# Patient Record
Sex: Male | Born: 1950 | Race: White | Hispanic: No | Marital: Married | Smoking: Former smoker
Health system: Southern US, Community
[De-identification: ages and names within clinical notes are randomized; demographics above are authoritative.]

## PROBLEM LIST (undated history)

## (undated) DIAGNOSIS — G8929 Other chronic pain: Secondary | ICD-10-CM

## (undated) DIAGNOSIS — K219 Gastro-esophageal reflux disease without esophagitis: Secondary | ICD-10-CM

## (undated) DIAGNOSIS — E785 Hyperlipidemia, unspecified: Secondary | ICD-10-CM

## (undated) DIAGNOSIS — I10 Essential (primary) hypertension: Secondary | ICD-10-CM

## (undated) DIAGNOSIS — I499 Cardiac arrhythmia, unspecified: Secondary | ICD-10-CM

## (undated) DIAGNOSIS — R002 Palpitations: Secondary | ICD-10-CM

## (undated) DIAGNOSIS — M25569 Pain in unspecified knee: Secondary | ICD-10-CM

## (undated) DIAGNOSIS — G4733 Obstructive sleep apnea (adult) (pediatric): Secondary | ICD-10-CM

## (undated) DIAGNOSIS — I2510 Atherosclerotic heart disease of native coronary artery without angina pectoris: Secondary | ICD-10-CM

## (undated) DIAGNOSIS — M549 Dorsalgia, unspecified: Secondary | ICD-10-CM

## (undated) DIAGNOSIS — M25561 Pain in right knee: Secondary | ICD-10-CM

## (undated) DIAGNOSIS — Z8679 Personal history of other diseases of the circulatory system: Secondary | ICD-10-CM

## (undated) DIAGNOSIS — R55 Syncope and collapse: Secondary | ICD-10-CM

## (undated) DIAGNOSIS — M545 Low back pain, unspecified: Secondary | ICD-10-CM

## (undated) HISTORY — DX: Low back pain: M54.5

## (undated) HISTORY — DX: Other chronic pain: G89.29

## (undated) HISTORY — DX: Hyperlipidemia, unspecified: E78.5

## (undated) HISTORY — DX: Atherosclerotic heart disease of native coronary artery without angina pectoris: I25.10

## (undated) HISTORY — DX: Palpitations: R00.2

## (undated) HISTORY — DX: Low back pain, unspecified: M54.50

## (undated) HISTORY — DX: Gastro-esophageal reflux disease without esophagitis: K21.9

## (undated) HISTORY — DX: Pain in right knee: M25.561

## (undated) HISTORY — DX: Dorsalgia, unspecified: M54.9

## (undated) HISTORY — DX: Pain in unspecified knee: M25.569

## (undated) HISTORY — DX: Personal history of other diseases of the circulatory system: Z86.79

## (undated) HISTORY — PX: COLONOSCOPY: SHX174

## (undated) HISTORY — PX: CHOLECYSTECTOMY: SHX55

## (undated) HISTORY — DX: Obstructive sleep apnea (adult) (pediatric): G47.33

---

## 1978-02-19 HISTORY — PX: VASECTOMY: SHX75

## 1980-02-20 HISTORY — PX: KNEE ARTHROSCOPY: SUR90

## 2000-05-27 ENCOUNTER — Emergency Department (HOSPITAL_COMMUNITY): Admission: EM | Admit: 2000-05-27 | Discharge: 2000-05-27 | Payer: Self-pay | Admitting: Emergency Medicine

## 2000-05-27 ENCOUNTER — Encounter: Payer: Self-pay | Admitting: Emergency Medicine

## 2001-04-21 ENCOUNTER — Encounter (INDEPENDENT_AMBULATORY_CARE_PROVIDER_SITE_OTHER): Payer: Self-pay | Admitting: Specialist

## 2001-04-21 ENCOUNTER — Ambulatory Visit (HOSPITAL_COMMUNITY): Admission: RE | Admit: 2001-04-21 | Discharge: 2001-04-21 | Payer: Self-pay | Admitting: Gastroenterology

## 2001-10-21 ENCOUNTER — Encounter: Admission: RE | Admit: 2001-10-21 | Discharge: 2001-10-21 | Payer: Self-pay | Admitting: Family Medicine

## 2001-10-21 ENCOUNTER — Encounter: Payer: Self-pay | Admitting: Family Medicine

## 2004-02-20 HISTORY — PX: LAPAROSCOPIC CHOLECYSTECTOMY: SUR755

## 2004-02-20 HISTORY — PX: ERCP: SHX60

## 2004-02-25 ENCOUNTER — Ambulatory Visit (HOSPITAL_COMMUNITY): Admission: RE | Admit: 2004-02-25 | Discharge: 2004-02-25 | Payer: Self-pay | Admitting: Family Medicine

## 2004-05-04 ENCOUNTER — Ambulatory Visit (HOSPITAL_COMMUNITY): Admission: RE | Admit: 2004-05-04 | Discharge: 2004-05-04 | Payer: Self-pay | Admitting: Family Medicine

## 2004-06-18 ENCOUNTER — Ambulatory Visit (HOSPITAL_COMMUNITY): Admission: RE | Admit: 2004-06-18 | Discharge: 2004-06-18 | Payer: Self-pay | Admitting: Gastroenterology

## 2004-06-25 ENCOUNTER — Emergency Department (HOSPITAL_COMMUNITY): Admission: EM | Admit: 2004-06-25 | Discharge: 2004-06-25 | Payer: Self-pay | Admitting: Emergency Medicine

## 2004-07-24 ENCOUNTER — Encounter (INDEPENDENT_AMBULATORY_CARE_PROVIDER_SITE_OTHER): Payer: Self-pay | Admitting: Specialist

## 2004-07-25 ENCOUNTER — Inpatient Hospital Stay (HOSPITAL_COMMUNITY): Admission: RE | Admit: 2004-07-25 | Discharge: 2004-07-26 | Payer: Self-pay | Admitting: Surgery

## 2005-07-10 IMAGING — RF DG ERCP WO/W SPHINCTEROTOMY
6 series · 6 of 6 positions shown · non-contrast
Comparison: none

CLINICAL DATA: Chronic cholecystitis.  
 ERCP WITH SPHINCTEROTOMY
 Seven images obtained during ERCP are submitted after the procedure for review.  Initial image demonstrates opacification of the pancreatic duct.  Second image shows a wire in the common duct which is now opacified.  Final four images were obtained after sphincterotomy and balloon passage for a reported ductal stone.  
 The last three images show residual contrast in the common duct without an apparent filling defect.

[Series 1: run · 1 of 1 slices shown (1 of 6)]
[im 1/1]
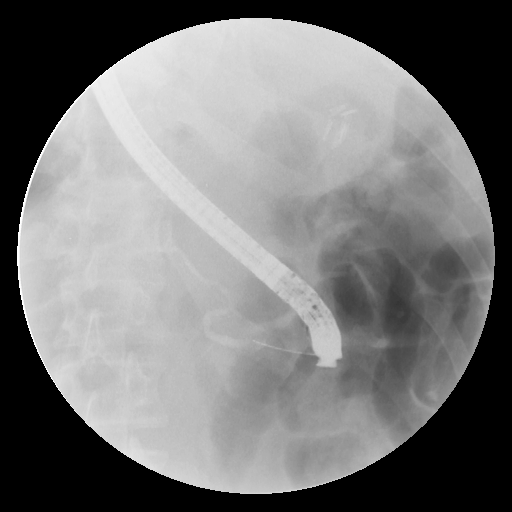

[Series 3: run · 1 of 1 slices shown (2 of 6)]
[im 1/1]
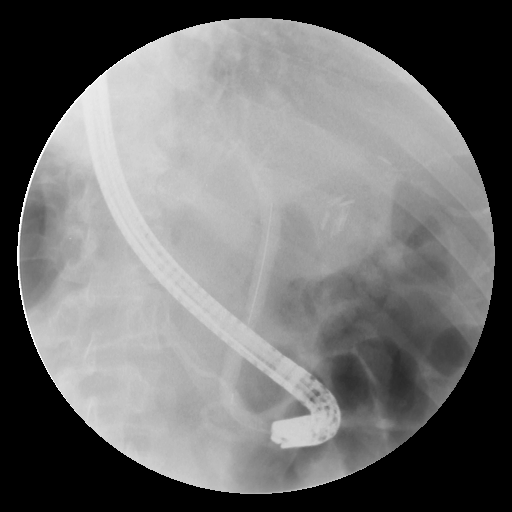

[Series 4: run · 1 of 1 slices shown (3 of 6)]
[im 1/1]
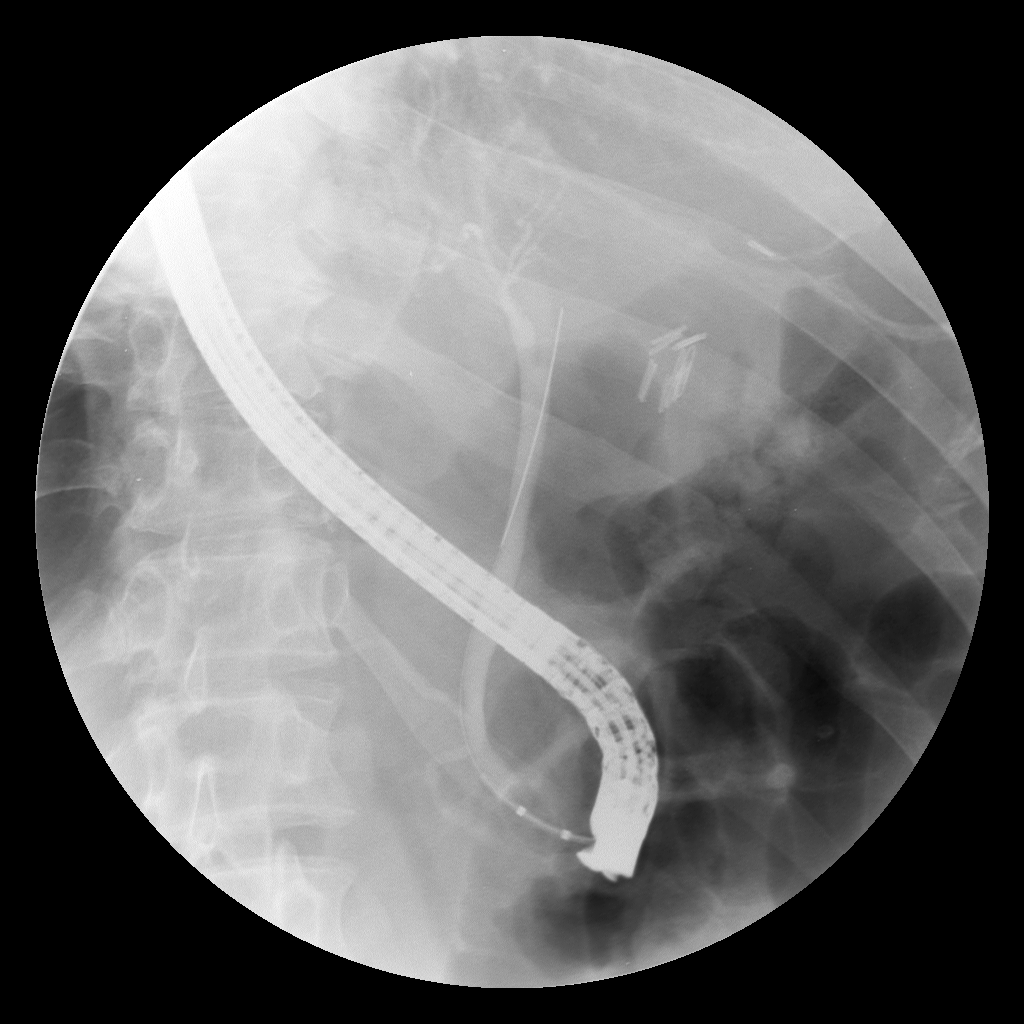

[Series 5: run · 1 of 1 slices shown (4 of 6)]
[im 1/1]
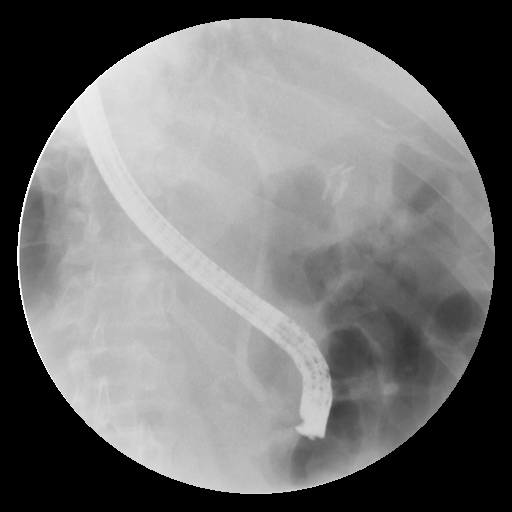

[Series 6: run · 1 of 1 slices shown (5 of 6)]
[im 1/1]
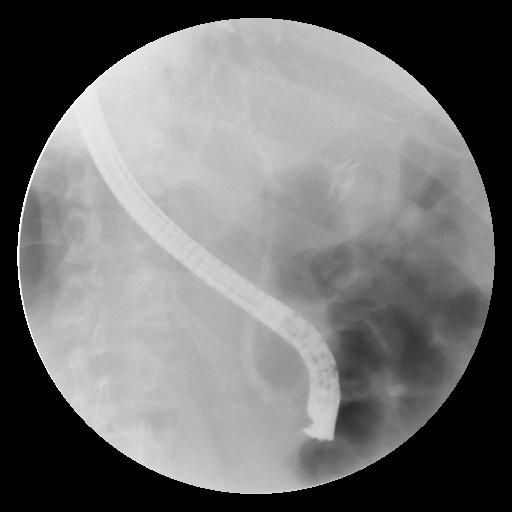

[Series 7: run · 1 of 1 slices shown (6 of 6)]
[im 1/1]
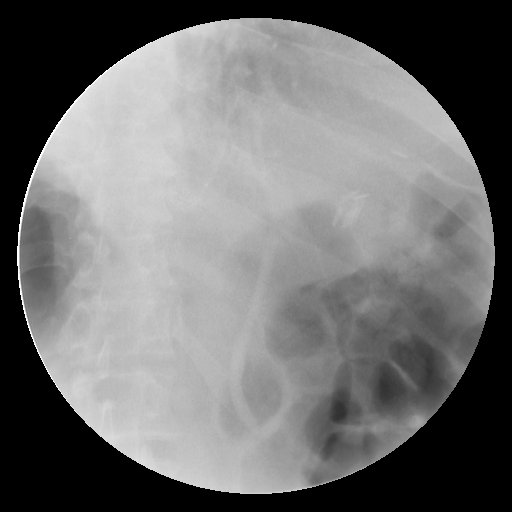

[6 of 6 positions shown; findings below may reference images not displayed]

IMPRESSION: ERCP with sphincterotomy for stone removal.  Final three images of the study after the wire has been removed from the duct demonstrate no residual filling defect although the duct is not well opacified. 
 [REDACTED]

## 2007-02-20 DIAGNOSIS — I2510 Atherosclerotic heart disease of native coronary artery without angina pectoris: Secondary | ICD-10-CM

## 2007-02-20 DIAGNOSIS — Z8679 Personal history of other diseases of the circulatory system: Secondary | ICD-10-CM

## 2007-02-20 HISTORY — DX: Atherosclerotic heart disease of native coronary artery without angina pectoris: I25.10

## 2007-02-20 HISTORY — DX: Personal history of other diseases of the circulatory system: Z86.79

## 2007-06-17 ENCOUNTER — Observation Stay (HOSPITAL_COMMUNITY): Admission: EM | Admit: 2007-06-17 | Discharge: 2007-06-18 | Payer: Self-pay | Admitting: Emergency Medicine

## 2007-06-18 ENCOUNTER — Encounter (INDEPENDENT_AMBULATORY_CARE_PROVIDER_SITE_OTHER): Payer: Self-pay | Admitting: Internal Medicine

## 2007-06-18 ENCOUNTER — Ambulatory Visit: Payer: Self-pay | Admitting: Surgery

## 2007-07-21 HISTORY — PX: CORONARY ANGIOPLASTY: SHX604

## 2007-08-08 ENCOUNTER — Inpatient Hospital Stay (HOSPITAL_BASED_OUTPATIENT_CLINIC_OR_DEPARTMENT_OTHER): Admission: RE | Admit: 2007-08-08 | Discharge: 2007-08-08 | Payer: Self-pay | Admitting: Cardiology

## 2010-07-04 NOTE — Discharge Summary (Signed)
[name], David [name]              ACCOUNT NO.:  [id number]   MEDICAL RECORD NO.:  [id number]          PATIENT TYPE:  INP   LOCATION:  4705                         FACILITY:  [facility]   PHYSICIAN:  Corinna L. Lendell Caprice, MDDATE OF BIRTH:  1950/10/14   DATE OF ADMISSION:  06/17/2007  DATE OF DISCHARGE:                               DISCHARGE SUMMARY   DISCHARGE DIAGNOSES:  1. Near-syncope, possibly vasovagal episode.  2. History of sleep apnea.  3. Hyperlipidemia.  4. Gastroesophageal reflux disease.   DISCHARGE MEDICATIONS:  Reviewed and as per H&P.  Follow up with Dr.  Elias Else within 1 week at which time, echocardiogram results should  be checked as it is pending at this time.   DIET:  Regular.   ACTIVITY:  Ad Lib.   CONDITION:  Stable.   CONSULTATIONS:  None.   PROCEDURES:  None.   PERTINENT LABORATORY DATA:  Point-of-care Enzymes negative.  Complete  metabolic panel significant for potassium of 5.4.  Urinalysis negative.  PT/PTT normal.  CBC unremarkable.   SPECIAL STUDIES/RADIOLOGY:  Echocardiogram pending.  EKG showed normal  sinus rhythm.  Chest x-ray showed nothing acute.  CT of the brain was  normal.  Preliminary report of the carotid Doppler is negative.   HISTORY AND HOSPITAL COURSE:  [name] is a 60 year old white male  patient of Dr. Nicholos Johns, who presented to the emergency room with a  presyncopal episode.  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  He reported that, he began  breathing quite heavily and subsequently started to see black.  He never  fully lost consciousness.  This has occurred in the past and apparently  he had an extensive workup 10 years ago including what sounds like a  Holter monitor.  The patient otherwise has been feeling fine.  Please  see H&P for details.  He had no change in orthostatic blood pressure.  He was not hypotensive.  He had a  normal exam.  He remained in normal sinus  rhythm on telemetry and workup  thus far has been negative.  I suspect this may have been vasovagal.  Nevertheless, I recommend that he follow up with Dr. Nicholos Johns within a week  and further workup if needed.  The patient's status has been changed  from admission to observation status.      Corinna L. Lendell Caprice, MD  Electronically Signed     CLS/MEDQ  D:  06/18/2007  T:  06/19/2007  Job:  [id number]   cc:   Molly Maduro A. Nicholos Johns, M.D.

## 2010-07-04 NOTE — Cardiovascular Report (Signed)
David, [name] NO.:  [id number]   MEDICAL RECORD NO.:  [id number]          PATIENT TYPE:  OIB   LOCATION:  1963                         FACILITY:  [facility]   PHYSICIAN:  Jake Bathe, MD      DATE OF BIRTH:  [birth year]   DATE OF PROCEDURE:  08/08/2007  DATE OF DISCHARGE:                            CARDIAC CATHETERIZATION   PROCEDURES:  1. Left heart catheterization.  2. Selective coronary angiography.  3. Left ventriculogram.   INDICATIONS:  A 60 year old male with syncopal episode while sitting  down, hyperlipidemia, GERD, obstructive sleep apnea, who recently had a  stress test secondary to the syncopal episode that showed an anterior  septal wall defect concerning for ischemia.  His ejection fraction was  also low calculated at 44% during nuclear stress test.  Prior  echocardiogram showed a normal ejection fraction of 55% with normal wall  thickness and normal left atrial size.  His sister in [place] recently  had cardiac evaluation for chest pain.   PAST MEDICAL HISTORY:  Significant for PSVT.   PROCEDURE DETAILS:  Informed consent was obtained.  Risk of stroke,  heart attack, death, renal impairment, bleeding, and arterial damage  were explained to the patient at length.  He was placed in the  catheterization table and prepped in a sterile fashion.  A 1% lidocaine  was used for local anesthesia.  Using the modified Seldinger technique,  a 4-French sheath was placed in the right femoral artery.  The left  coronary artery was selectively cannulated with a JL-4 catheter.  This  catheter was exchanged for a no-torque Williams right catheter, which  was used to selectively cannulate the right coronary artery.  Both  arteries were injected with Omnipaque hand injections and multiple views  were obtained.  Catheters were exchanged for angle pigtail, which was  used across the aortic valve and the left ventricle.  Hemodynamics  obtained.  Left  ventriculogram in the RAO position with power injection  of 36 mL of contrast was utilized.  A gradient obtained.  Following  procedure, manual compression held.  No hematoma.  The patient tolerated  the procedure well.   FINDINGS:  1. Left main artery - no angiographically significant coronary artery      disease.  2. Left anterior descending artery - there are 2 diagonal branches,      the first of which has approximately 20% ostial stenosis, very      minor.  Otherwise, the LAD continues to wrap around the apex.  No      angiographically significant coronary artery disease.  3. Left circumflex artery - there are 2 obtuse marginal branches.      There is no angiographically significant coronary artery disease.  4. Right coronary artery - this is a dominant vessel giving rise to      the PDA.  There is no angiographically significant coronary artery      disease.  5. Left ventriculogram - normal left ventricular ejection fraction      estimated at 55% with no wall motion abnormality.  No significant  mitral regurgitation appreciated. Ascending aortic root appears      normal.   HEMODYNAMICS:  Left ventricle 113/18 mmHg and aorta 113/60 with a mean  of 88 mmHg.   IMPRESSION:  1. No angiographically significant coronary artery disease, very mild      20% stenosis of the small first diagonal branch.  2. Normal left ventricular ejection fraction estimated at 55% with no      wall motion abnormalities.  3. Elevated left ventricular end-diastolic pressure of 80 mmHg      consistent with diastolic dysfunction.   RECOMMENDATIONS:  Continue aggressive risk factor modification, which  includes treatment of his hyperlipidemia, also treating PSVT as well as  diastolic dysfunction with cardizem CD 180mg . Has had recent bouts of  PSVT with dizziness. If calcium channel blocker unsuccessful, consider  EP consult. 2 week follow up. Reassuring catheterization.      Jake Bathe, MD   Electronically Signed     MCS/MEDQ  D:  08/08/2007  T:  08/08/2007  Job:  [id number]   cc:   Elana Alm. Nicholos Johns, M.D.

## 2010-07-04 NOTE — H&P (Signed)
NAMEFRISCO, David [name]              ACCOUNT NO.:  [id number]   MEDICAL RECORD NO.:  [id number]          PATIENT TYPE:  INP   LOCATION:  4705                         FACILITY:  [facility]   PHYSICIAN:  Kela Millin, M.D.DATE OF BIRTH:  1950-12-17   DATE OF ADMISSION:  06/17/2007  DATE OF DISCHARGE:                              HISTORY & PHYSICAL   PRIMARY CARE PHYSICIAN:  Robert A. Nicholos Johns, M.D.   CHIEF COMPLAINT:  Presyncope   HISTORY OF PRESENT ILLNESS:  The patient is a 60 year old white male  with a history of GERD, hyperlipidemia, bowel duct stone in the past,  and status post ERCP with sphincterotomy in 2006, who presents with the  above complaints.  He states that he was sitting down in his office with  a guest at his desk when he suddenly felt lightheaded, and then he  leaned back into the chair to keep himself from passing out, and  momentarily he states that everything blacked out, but he insists that  he did not lose consciousness at any point..  He states that following  that he continued to talk with his guest, and then again he had an  episode of lightheadedness.  Following this, he reports that he had a  quick, sharp chest pain that went across the left side of his chest for  just about a second, and he denies shortness of breath, diaphoresis, and  no radiation.  He admits to nausea but no vomiting.  At the time of my  interview in the ER, he states he still has some pain in his left upper  chest area close to his shoulder, but that this is different from the  pains he earlier, but it is worse with movement of his shoulder, and he  attributes it to some lifting that he did.  He denies palpitations,  cough, fevers, dysuria, diarrhea, melena, and no hematochezia.  The  patient also states that for a couple of days he had this sensation of a  band around his head, and he is quick to state that it was not like a  headache but just felt like he had a hat on, but he did not.   He denies  blurry vision, dysphagia, and no focal weakness.  He also reports that  for some time now he has had difficulty sleeping because he has sleep  apnea, and has a CPAP machine, but he has not been using it because he  has had problems with some infections on his face associated with the  mask that he has tried so far, but he is scheduled to follow up with his  physician to try to obtain a different face mask.  The patient remembers  that as a child growing up he had some episodes of fainting/passing out,  and these were usually if he was hurt or someone hit him, and that the  last such episodes that he had while in class was when he was in the  ninth grade.   He was seen in the ER, and point of care markers were negative.  A chest  x-ray was done.  It was negative for any cardiopulmonary process.  He is  admitted for further evaluation and management.   PAST MEDICAL HISTORY:  As above.   MEDICATIONS:  1. Aspirin 81 mg daily.  2. Nexium 40 mg daily.  3. Crestor 10 mg daily.   ALLERGIES:  NKDA.   SOCIAL HISTORY:  He quit tobacco about 6-7 years ago.  He drinks a  couple of beers about every other day or so during the week, but on the  weekends he drinks 4-5 beers per day.  He also states that some weeks he  goes without drinking, and denies any history of withdrawals.   FAMILY HISTORY:  His brother had a stroke at age 3, and he has some  septal defect.  His brother has a history of arrhythmias.  His mother  had a stroke and abdominal aneurysm, as well as carotid artery disease.   REVIEW OF SYSTEMS:  As per HPI.  Other review of systems negative.   PHYSICAL EXAMINATION:  GENERAL:  He is a middle-aged white male.  He is  alert, talkative, and appropriate, in no apparent distress.  VITAL SIGNS:  His blood pressure is 130/74, with a pulse of 82,  respiratory rate of 20, O2 saturation of 98%.  HEENT:  PERRL, EOMI.  Sclerae anicteric.  Moist mucous membranes, and no  oral  exudates.  NECK:  Supple, no adenopathy, no thyromegaly, no JVD, and no carotid  bruits appreciated.  LUNGS:  Clear to auscultation bilaterally.  No crackles, no wheezes.  CARDIOVASCULAR:  Regular rate and rhythm.  Normal S1 and S2.  ABDOMEN:  Soft, bowel sounds present, nontender, nondistended.  No  organomegaly, and no masses palpable.  EXTREMITIES:  No cyanosis, and no edema.  NEUROLOGIC:  Alert and oriented x3.  Cranial nerves II-XII grossly  intact.  Strength is 5/5 and symmetric, nonfocal.   LABORATORY DATA:  White cell count is 5.2, with a hemoglobin of 13.9,  hematocrit of 40.6, platelet count is 291.  Point of care markers  negative x3 in the ER.  Sodium is 138, with a potassium of 5.4, chloride  103, BUN 12, creatinine 1, glucose 99.  His LFTs are unremarkable.   ASSESSMENT AND PLAN:  1. Presyncope.  As discussed above, we will check orthostatics,      hydrate, EKG, serial cardiac enzymes, monitor on telemetry.  We      will also obtain a CT scan of his head, a 2D echocardiogram,      carotid Doppler ultrasound, follow, and further manage as      appropriate pending these studies.  2. Gastroesophageal reflux disease.  Continue PPI.  3. Hypercholesterolemia.  Continue Crestor.  4. Sleep apnea.  We will place on CPAP while here in the hospital,      follow.  5. Alcohol use.  Will place on multivitamins, thiamine, p.r.n. Ativan,      and monitor.      Kela Millin, M.D.  Electronically Signed     ACV/MEDQ  D:  06/18/2007  T:  06/18/2007  Job:  [id number]   cc:   Molly Maduro A. Nicholos Johns, M.D.

## 2010-07-07 NOTE — Op Note (Signed)
NAMETRAVAS, SCHEXNAYDER NO.:  [id number]   MEDICAL RECORD NO.:  [id number]          PATIENT TYPE:  AMB   LOCATION:  DAY                          FACILITY:  [facility]   PHYSICIAN:  Velora Heckler, MD      DATE OF BIRTH:  [birth year]   DATE OF PROCEDURE:  07/24/2004  DATE OF DISCHARGE:                                 OPERATIVE REPORT   PREOPERATIVE DIAGNOSES:  Chronic cholecystitis, abdominal pain, abnormal  liver function tests.   POSTOPERATIVE DIAGNOSES:  Chronic cholecystitis, abdominal pain, abnormal  liver function tests, probable CBD stone.   PROCEDURES:  1.  Laparoscopic cholecystectomy with intraoperative cholangiography.  2.  Liver biopsy (wedge).   SURGEON:  Velora Heckler, M.D.   ASSISTANT:  Lorne Skeens. Hoxworth, M.D.   ANESTHESIA:  General per Hezzie Bump. Okey Dupre, M.D.   ESTIMATED BLOOD LOSS:  Minimal.   PREPARATION:  Betadine.   COMPLICATIONS:  None.   INDICATIONS:  The patient is a 60 year old white male from [place], [place], referred by Dr. Carman Ching for history of abdominal pain. This  was intermittent. It occurs in the right upper quadrant and radiates to the  back. The patient has had abnormal liver function tests with a total  bilirubin rising as high as 6.4 on occasion. Extensive diagnostic testing  included abdominal ultrasound, CT scan, MRCP, and nuclear medicine  hepatobiliary scanning. All these studies demonstrated only a thick-walled  gallbladder. The patient now comes to surgery for cholecystectomy,  cholangiography, and liver biopsy.   BODY OF REPORT:  The procedure is done in OR #6 at the [facility]. The patient was brought to the operating room and placed in a  supine position on the operating room table. Following administration of  general anesthesia, the patient was prepped and draped in the usual strict  aseptic fashion. After ascertaining that an adequate level of anesthesia had  been  obtained, an infraumbilical incision was made with a #15 blade.  Dissection was carried down to the fascia. The fascia was incised in the  midline. The peritoneal cavity was entered cautiously. A 0 Vicryl  pursestring suture was placed in the fascia. An Hasson cannula was  introduced under direct vision and secured with a pursestring suture. The  abdomen was insufflated with carbon dioxide. The laparoscope was introduced  and the abdomen explored. The internal viscera appeared grossly normal. The  liver appears normal. Operative ports were placed along the right costal  margin, the midline, midclavicular line, and the anterior axillary line. The  fundus of the gallbladder was grasped and retracted cephalad. Dissection was  begun at the neck of the gallbladder. The peritoneum was incised. The cystic  duct was mildly dilated. It appears quite long. It was carefully dissected  out, and a clip was placed at the neck of the gallbladder. The cystic duct  was incised. Clear yellow bile emanates from the cystic duct. A Cook  cholangiography catheter was introduced through a stab wound in the right  upper quadrant and inserted into the cystic duct. It was secured with a  Ligaclip.  Using C-arm fluoroscopy, real-time cholangiography was performed.  There was rapid filling of the long tortuous cystic duct. There was filling  of a generous caliber common bile duct that does not appear grossly dilated.  There was reflux of contrast into the common hepatic duct. There was flow  distally to the ampulla but minimal flow into the duodenum. There appears to  be a convexity in the dye column consistent with possible stone at the level  of the ampulla. The [facility] catheter was withdrawn, and clip was retrieved. The  cystic duct was clipped four times with Ligaclips and divided. The cystic  artery was dissected out, doubly clipped, and divided. The gallbladder was  then excised from the gallbladder bed using the  [facility] electrocautery for  hemostasis. The gallbladder was completely excised and placed into an  EndoCatch bag.   Next, a wedge biopsy of the liver was performed. This was performed at the  right hepatic lobe anterior edge just in front of the gallbladder fossa. The  edge was grasped with a stone forceps and crushing with the instrument was  performed. Using the [facility] electrocautery, the margin around the biopsy site  was cauterized with the electrocautery. A 1-cm x 1-cm x 1-cm fragment of  liver was then delivered out of the epigastric port and submitted to  pathology for review. Good hemostasis was noted along the liver edge. The  right upper quadrant was copiously irrigated with warm saline which was  evacuated. Good hemostasis was noted in the gallbladder bed. Gallbladder was  retrieved in the EndoCatch bag and brought out through the umbilical port  without difficulty. The 0 Vicryl pursestring suture was tied securely.  Palpation of the gallbladder reveals no evidence of cholelithiasis. The  pneumoperitoneum was released. The port sites were inspected, and good  hemostasis was noted at all port sites. All ports were removed. All port  sites were anesthetized with local anesthetic. All wounds were closed with  interrupted 4-0 Vicryl subcuticular  sutures. The wounds were washed and dried. Benzoin and Steri-Strips were  applied. Sterile dressings were applied. The patient was awakened from  anesthesia and brought to the recovery room in stable condition. The patient  tolerated the procedure well.       TMG/MEDQ  D:  07/24/2004  T:  07/24/2004  Job:  [id number]   cc:   Fayrene Fearing L. Malon Kindle., M.D.  [address]  [place]  [place] [address]  Fax: [phone]   Elana Alm. Nicholos Johns, M.D.  [address]  [place]  [place] [address]  Fax: [phone]   Velora Heckler, MD  [phone] [address] [place]  [place] [address]

## 2010-07-07 NOTE — Procedures (Signed)
[place]  Patient:    David [name], David [name] Visit Number: [id number] MRN: [id number]          Service Type: END Location: ENDO Attending Physician:  Rich Brave Dictated by:   Florencia Reasons, M.D. Proc. Date: 04/21/01 Admit Date:  04/21/2001   CC:         Molly Maduro A. Eliezer Lofts., M.D.   Procedure Report  PROCEDURE PERFORMED:  Colonoscopy with biopsy.  ENDOSCOPIST:  Florencia Reasons, M.D.  INDICATIONS FOR PROCEDURE:  The patient is a 60 year old male with a family history of colon cancer in his father.  FINDINGS:  Diminutive polyp at 30 cm.  DESCRIPTION OF PROCEDURE:  The nature, purpose and risks of the procedure had been discussed with the patient, who provided written consent.  Sedation was fentanyl 100 mcg and Versed 14 mg IV without arrhythmias or desaturation.  The Olympus adult video colonoscope was advanced easily to the cecum as identified by clear visualization of the appendiceal orifice and pull-back was then performed.  The quality of the prep was excellent and it is felt that all areas were well seen.  This was a normal examination except for a 2 to 3 mm sessile polyp at about 30 cm in the external anal opening removed by a couple of cold biopsies.  No large polyps, cancer, colitis, vascular malformations or diverticulosis were noted and retroflexion in the rectum was normal.  The patient tolerated the procedure well and there were no apparent complications.  IMPRESSION:  Diminutive sigmoid polyp.  PLAN: Consider follow-up colonoscopy in five years in view of the family history of colon cancer.Dictated by:   Florencia Reasons, M.D. Attending Physician:  Rich Brave DD:  04/21/01 TD:  04/21/01 Job: [id number]

## 2010-07-07 NOTE — Op Note (Signed)
NAMEJAMISEN, David [name] NO.:  [id number]   MEDICAL RECORD NO.:  [id number]          PATIENT TYPE:  OBV   LOCATION:  1615                         FACILITY:  [facility]   PHYSICIAN:  Bernette Redbird, M.D.   DATE OF BIRTH:  06-May-1950   DATE OF PROCEDURE:  07/25/2004  DATE OF DISCHARGE:                                 OPERATIVE REPORT   PROCEDURE:  ERCP, sphincterotomy, stone extraction.   INDICATIONS:  A 60 year old with recurring abdominal pain and intermittent  elevation of liver chemistries with negative MRCP, who underwent an empiric  laparoscopic cholecystectomy yesterday at which time intraoperative  cholangiography showed an apparent distal common duct stone.   FINDINGS:  Small stone removed.   DESCRIPTION OF PROCEDURE:  The nature, purpose and risks of the procedure  had been discussed with the patient who provided written consent. Sedation  was Phenergan 12.5 mg IV, fentanyl 62.5 mcg IV and Versed 7 mg IV prior to  and during the course of the procedure. He also received Cipro IV prior to  the procedure and perhaps during the early part of the procedure as  antibiotic prophylaxis, and glucagon 1 mg IV during the course the procedure  to reduce duodenal contractility.   The patient did have probable transient oxygen desaturation down to about  79% before the procedure was started, which responded to turning up his  oxygen flow rate and repositioning the pulse ox probe. It is not clear  whether he was truly desaturated or whether this was just a problem with the  probe picking up correctly but the patient was arousable and his color was  good. In any event, he remained clinically stable during the course of the  procedure. There were no other episodes of apparent desaturation.   The procedure was done in radiology. The Olympus video duodenoscope was  passed blindly into the esophagus and advanced into a normal-appearing  stomach. A moderate gastric residual of  dark clear fluid was suctioned up.  It took some doing to traverse the pylorus and the stomach seen to be  somewhat patulous, but with the patient temporarily lifted up into the left  lateral decubitus position, this was ultimately accomplished after several  minutes of trying.   The major ampulla was rapidly identified, adjacent to a moderately large  duodenal diverticulum. There was a squirt of a rather copious amount of  clear amber bile from the papilla.   It took several minutes to achieve selective cannulation of her common duct.  On one occasion, the guidewire did enter the pancreatic duct as confirmed by  injection of a small amount of contrast which opacified the majority of the  duct but not the side branches and certainly there was no evidence of  acinarization. The pancreatic duct did not drain immediately but did drained  during the course of the procedure over the next 10-15 minutes or so and.   Using the triple-lumen sphincterotome loaded with the guidewire, and using  continued efforts, I was successful in achieving selective cannulation of  the common bile duct. Injection of contrast did not show  any obvious  intraluminal filling defects. The common duct was at the upper limit of  normal in size and without any evident stricture.   Nonetheless, despite the absence of obvious stones in the duct, based on the  patient's history, I  felt that an empiric sphincterotomy was warranted.  This was accomplished using the sphincterotome and the ERBE cautery device.  I cut approximately a 12 mm sphincterotomy without bleeding. There was a  gush of clear amber bile.   A single initial sweep was made with an 8.5 mm balloon which came through  the sphincterotomy without significant difficulty, and ahead of it was a  solitary roughly 5-6 mm stone. Another sweep did not produce any additional  material and an occlusion cholangiogram thereafter showed no filling defects  although  drainage was somewhat sluggish.   The patient tolerated this procedure well and there were no apparent  complications.   IMPRESSION:  1.  5 mm common bile duct stone delivered via a 12 mm sphincterotomy as      described above.  2.  Duodenal diverticulum.   PLAN:  Monitoring of liver chemistries and clinical follow-up through the  office of his attending surgeon.      RB/MEDQ  D:  07/25/2004  T:  07/25/2004  Job:  [id number]   cc:   Velora Heckler, MD  [address]  [place]  [place] [address]   Dario Guardian, M.D.  [address]  [place]  [place] [address]  Fax: [phone]

## 2010-07-07 NOTE — Consult Note (Signed)
NAMERAWLEIGH, RODE NO.:  [id number]   MEDICAL RECORD NO.:  [id number]          PATIENT TYPE:  OBV   LOCATION:  1615                         FACILITY:  [facility]   PHYSICIAN:  Althea Grimmer. Santogade, M.D.DATE OF BIRTH:  Nov 05, 1950   DATE OF CONSULTATION:  07/24/2004  DATE OF DISCHARGE:                                   CONSULTATION   HISTORY OF PRESENT ILLNESS:  Mr. Cieslinski is a 60 year old male with a history  of apparent biliary colic with abnormal transaminases rising to as high as  an ALT of 565 with a total bilirubin of 6.4 associated with nausea and  abdominal pain.  These episodes have resolved intermittently.  Imaging  studies apparently revealed sludge in the neck of the gallbladder.  This  morning, he underwent a laparoscopic cholecystectomy.  Due to his history of  abnormal liver function tests, a wedge liver biopsy was performed.  There  was no mention that the liver looked abnormal on the operative note.  An  intraoperative cholangiogram was also done, and the preliminary reading is  that there is probable gallstones at the ampulla.  Currently, the patient is  postoperative.  He denies any worsening abdominal pain.  His ALT is 228, his  alkaline phosphatase is 137, his total bilirubin is 1.1.  He is alert and  only complaining of postoperative discomfort at the incision sites.   CURRENT MEDICATIONS:  Only Nexium for reflux.   ALLERGIES:  None reported.   FAMILY HISTORY:  Mother and sister had cholecystectomies.   SOCIAL HISTORY:  Moderate alcohol use.   PHYSICAL EXAMINATION:  VITAL SIGNS:  He is afebrile.  Blood pressure 137/81,  pulse 98 and regular.  SKIN:  Normal.  HEENT:  Eyes anicteric.  Conjunctivae pink.  Oropharynx unremarkable.  NECK:  Supple without thyromegaly.  CHEST:  Chest sounds clear.  HEART:  Heart sounds regular rate and rhythm.  ABDOMEN:  Mildly distended and postoperative with no significant drainage or  blood on the bandages.  EXTREMITIES:  Without cyanosis, clubbing, edema, or rash.   IMPRESSION:  Status post laparoscopic cholecystectomy with a probable distal  common bile duct stone.   PLAN:  Metabolic profile and CBC will be checked tomorrow, and the patient  tentatively is scheduled for an ERCP in the afternoon after review of his  morning status and labs.       PJS/MEDQ  D:  07/24/2004  T:  07/24/2004  Job:  [id number]   cc:   Bernette Redbird, M.D.  [address]  [place], [place] [address]  Fax: [phone]   Dario Guardian, M.D.  Fax: [phone]   Velora Heckler, MD  [phone]  [place]  [place] [address]   Llana Aliment. Malon Kindle., M.D.  [address]  [place]  [place] [address]  Fax: [phone]

## 2010-11-14 LAB — CBC
HCT: 40.6
Hemoglobin: 13.9
MCHC: 34.2
MCV: 87.6
Platelets: 291
RBC: 4.63
RDW: 12.5
WBC: 5.2

## 2010-11-14 LAB — URINALYSIS, ROUTINE W REFLEX MICROSCOPIC
Bilirubin Urine: NEGATIVE
Glucose, UA: NEGATIVE
Hgb urine dipstick: NEGATIVE
Ketones, ur: NEGATIVE
Nitrite: NEGATIVE
Protein, ur: NEGATIVE
Specific Gravity, Urine: 1.014
Urobilinogen, UA: 1
pH: 6.5

## 2010-11-14 LAB — POCT I-STAT, CHEM 8
BUN: 12
Calcium, Ion: 1.19
Chloride: 103
Creatinine, Ser: 1
Glucose, Bld: 99
HCT: 42
Hemoglobin: 14.3
Potassium: 5.4 — ABNORMAL HIGH
Sodium: 138
TCO2: 30

## 2010-11-14 LAB — POCT CARDIAC MARKERS
CKMB, poc: <1 — ABNORMAL LOW
CKMB, poc: <1 — ABNORMAL LOW
CKMB, poc: <1 — ABNORMAL LOW
Myoglobin, poc: 36.4
Myoglobin, poc: 46.7
Myoglobin, poc: 58.2
Operator id: 234501
Operator id: 234501
Operator id: 234501
Troponin i, poc: <0.05
Troponin i, poc: <0.05
Troponin i, poc: <0.05

## 2010-11-14 LAB — HEPATIC FUNCTION PANEL
ALT: 14
AST: 32
Albumin: 3.6
Alkaline Phosphatase: 32 — ABNORMAL LOW
Bilirubin, Direct: 0.5 — ABNORMAL HIGH
Indirect Bilirubin: 0.7
Total Bilirubin: 1.2
Total Protein: 6.6

## 2010-11-14 LAB — CK TOTAL AND CKMB (NOT AT ARMC)
CK, MB: 1.2
Relative Index: 1.1
Total CK: 111

## 2010-11-14 LAB — APTT: aPTT: 26

## 2010-11-14 LAB — TROPONIN I: Troponin I: 0.01

## 2010-11-14 LAB — URINE CULTURE
Colony Count: NO GROWTH
Culture: NO GROWTH
Special Requests: NEGATIVE

## 2010-11-14 LAB — CARDIAC PANEL(CRET KIN+CKTOT+MB+TROPI)
CK, MB: 1.1
Relative Index: 1
Total CK: 108
Troponin I: <0.01

## 2010-11-14 LAB — PROTIME-INR
INR: 0.9
Prothrombin Time: 12.7

## 2010-11-14 LAB — TSH: TSH: 2.389

## 2013-08-10 ENCOUNTER — Encounter: Payer: Self-pay | Admitting: *Deleted

## 2014-06-30 ENCOUNTER — Other Ambulatory Visit: Payer: Self-pay | Admitting: Family Medicine

## 2014-06-30 ENCOUNTER — Ambulatory Visit
Admission: RE | Admit: 2014-06-30 | Discharge: 2014-06-30 | Disposition: A | Discharge status: Home / Self Care | Payer: Commercial Managed Care - PPO | Source: Ambulatory Visit | Attending: Family Medicine | Admitting: Family Medicine

## 2014-06-30 DIAGNOSIS — R0600 Dyspnea, unspecified: Secondary | ICD-10-CM

## 2014-06-30 DIAGNOSIS — R0609 Other forms of dyspnea: Principal | ICD-10-CM

## 2014-07-22 ENCOUNTER — Other Ambulatory Visit: Payer: Self-pay

## 2014-07-22 ENCOUNTER — Ambulatory Visit (INDEPENDENT_AMBULATORY_CARE_PROVIDER_SITE_OTHER): Payer: Commercial Managed Care - PPO | Admitting: Internal Medicine

## 2014-07-22 VITALS — BP 147/80 | HR 64 | Ht 71.5 in | Wt 191.8 lb

## 2014-07-22 DIAGNOSIS — R079 Chest pain, unspecified: Secondary | ICD-10-CM

## 2014-07-22 DIAGNOSIS — R0789 Other chest pain: Secondary | ICD-10-CM

## 2014-07-22 DIAGNOSIS — R002 Palpitations: Secondary | ICD-10-CM | POA: Diagnosis not present

## 2014-07-22 DIAGNOSIS — R0989 Other specified symptoms and signs involving the circulatory and respiratory systems: Secondary | ICD-10-CM | POA: Diagnosis not present

## 2014-07-22 DIAGNOSIS — I471 Supraventricular tachycardia, unspecified: Secondary | ICD-10-CM | POA: Insufficient documentation

## 2014-07-22 DIAGNOSIS — R0602 Shortness of breath: Secondary | ICD-10-CM

## 2014-07-22 DIAGNOSIS — R42 Dizziness and giddiness: Secondary | ICD-10-CM

## 2014-07-22 NOTE — Progress Notes (Signed)
Electrophysiology Office Note   Date:  07/22/2014   ID:  David [name], DOB 1950/07/05, MRN [id number]  PCP:  Vena Austria, MD  Cardiologist:  Previously Dr Marlou Porch Primary Electrophysiologist: Thompson Grayer, MD  Referring:  [facility]que [facility]   Chief Complaint  Patient presents with  . Dizziness     History of Present Illness: David [name] is a 64 y.o. male who presents today for electrophysiology evaluation.  He presents for further management of dizziness, SOB and chest discomfort.  He reports having palpitations and dizziness for "years".  He was evaluated by Dr Marlou Porch in 2009 and found to have nonsustained atach.  He was placed on diltiazem and did very well until the last few months.  Recently, he has developed recurrent symptoms of dizziness.  He has not had palpitations with this.  He was seen by Dr Alyson Ingles and his diltiazem was increased without improvement.  He describes his symptoms as a "head spinning" but does not feel that these are vertiginous in etiology.  He denies recent URI symptoms, tinnitus, or a positional component.  He has not passed out.  These episodes have occurred several times per month over the past 3 months.  He is unaware of triggers/ precipitants. He reports that 2 nights ago that he had SOB with chest tightness and pain radiating into his neck.  This occurred at night and lasted about an hour and resolved with sleeping.   Today, he denies symptoms of lower extremity edema, claudication,  syncope, bleeding, or neurologic sequela. The patient is tolerating medications without difficulties and is otherwise without complaint today.    Past Medical History  Diagnosis Date  . OSA (obstructive sleep apnea)     BIPAP  . Knee pain, right     PARTICULARY AT NIGHT  . Back pain, chronic   . GERD (gastroesophageal reflux disease)   . Hyperlipidemia     INTOLERANT OF STATINS  . Palpitations   . History of PSVT (paroxysmal supraventricular tachycardia)  2009    nonsustained atach per Dr Marlou Porch on monitor in 2009. treated with diltiazem  . Chronic low back pain   . Chronic knee pain   . CAD (coronary artery disease) 2009    nonobstructive by cath   Past Surgical History  Procedure Laterality Date  . Knee arthroscopy Right 1982  . Vasectomy  1980  . Ercp  2006  . Laparoscopic cholecystectomy  2006  . Colonoscopy      2003, 2008, 2013  . Coronary angioplasty  07/2007    NONOBSTRUCTIVE CORONARY PLAQUING- MINOR 20 % OSTIAL DIAGONAL STENOSIS, NORMAL LEFT VENTRICULAR EF 55%  . Cholecystectomy       Current Outpatient Prescriptions  Medication Sig Dispense Refill  . aspirin EC 81 MG tablet Take 81 mg by mouth daily.    Marland Kitchen diltiazem (DILACOR XR) 180 MG 24 hr capsule Take 180 mg by mouth 2 (two) times daily.     Marland Kitchen esomeprazole (NEXIUM) 40 MG capsule Take 40 mg by mouth daily at 12 noon.    . Multiple Vitamins-Minerals (MULTIVITAMIN WITH MINERALS) tablet Take 1 tablet by mouth daily.    . rosuvastatin (CRESTOR) 10 MG tablet Take 10 mg by mouth daily.    . cyclobenzaprine (FLEXERIL) 5 MG tablet PT. HAS NOT STARTED THIS MEDICATION YET (07/22/14)  0   No current facility-administered medications for this visit.    Allergies:   Review of patient's allergies indicates no known allergies.   Social History:  The  patient  reports that he quit smoking about 14 years ago. His smoking use included Cigarettes. He smoked 2.50 packs per day. He does not have any smokeless tobacco history on file. He reports that he drinks alcohol.   Family History:  The patient's  family history includes CVA (age of onset: 3) in his mother; Colon cancer in his father; Diabetes in his maternal grandmother; Dysrhythmia in his brother; GER disease in his sister; Heart attack in his father; Heart attack (age of onset: 60) in his mother; Hyperlipidemia in his sister; Hypertension in his brother and sister; Pneumonia in his father; Stroke (age of onset: 61) in his brother.     ROS:  Please see the history of present illness.   All other systems are reviewed and negative.    PHYSICAL EXAM: VS:  BP 147/80 mmHg  Pulse 64  Ht 5' 11.5" (1.816 m)  Wt 87 kg (191 lb 12.8 oz)  BMI 26.38 kg/m2 , BMI Body mass index is 26.38 kg/(m^2). GEN: Well nourished, well developed, in no acute distress HEENT: normal Neck: no JVD, + R carotid bruit Cardiac: RRR; no murmurs, rubs, or gallops,no edema  Respiratory:  clear to auscultation bilaterally, normal work of breathing GI: soft, nontender, nondistended, + BS MS: no deformity or atrophy Skin: warm and dry  Neuro:  Strength and sensation are intact Psych: euthymic mood, full affect  EKG:  EKG is ordered today. The ekg ordered today shows sinus rhythm, PVCs   Recent Labs: No results found for requested labs within last 365 days.    Lipid Panel  No results found for: CHOL, TRIG, HDL, CHOLHDL, VLDL, LDLCALC, LDLDIRECT   Wt Readings from Last 3 Encounters:  07/22/14 87 kg (191 lb 12.8 oz)      Other studies Reviewed: Additional studies/ records that were reviewed today include: more than 10 pages of records are reviewed including Dr Reade's notes and Clarisa Fling notes, prior myoview, echo, and cath reports  Review of the above records today demonstrates: recent echo reveals preserved EF with no valvular abnormality, prior myoview was abnormal (anterior ischemic) with subsequent cath revealing minimal CAD   ASSESSMENT AND PLAN:  1.  Dizziness/ presyncope Unclear etiology.  Orthostatics performed today were normal Cannot exclude arrhythmogenic cause Will place 30 day event monitor Adequate hydration is encouraged Given R carotid bruit, will also obtain carotid dopplers  2. Chest discomfort/ SOB Given worsening symptoms, will obtain GXT myoview.  Given previously abnormal myoview, would need to compare imaging.  Would not pursue additional workup unless significant changes are apparent  3. Prior atach/  SVT Repeat 30 day monitor as above Continue diltiazem for now  Current medicines are reviewed at length with the patient today.   The patient does not have concerns regarding his medicines.  The following changes were made today:  none  Labs/ tests ordered today include:  Orders Placed This Encounter  Procedures  . Cardiac event monitor  . Myocardial Perfusion Imaging  . EKG 12-Lead    Follow-up: return for further evaluation in 6 weeks (after results of the 30 day monitor are available)   Signed, Thompson Grayer, MD  07/22/2014 9:50 PM     [place] [address] [place] [address] [phone] (office) [phone] (fax)

## 2014-07-22 NOTE — Patient Instructions (Signed)
Medication Instructions:  Your physician recommends that you continue on your current medications as directed. Please refer to the Current Medication list given to you today.   Labwork: None ordered  Testing/Procedures: Your physician has requested that you have a carotid duplex. This test is an ultrasound of the carotid arteries in your neck. It looks at blood flow through these arteries that supply the brain with blood. Allow one hour for this exam. There are no restrictions or special instructions.  Your physician has requested that you have en exercise stress myoview. For further information please visit [email or web address]. Please follow instruction sheet, as given.  Your physician has recommended that you wear an event monitor. Event monitors are medical devices that record the heart's electrical activity. Doctors most often [place] these monitors to diagnose arrhythmias. Arrhythmias are problems with the speed or rhythm of the heartbeat. The monitor is a small, portable device. You can wear one while you do your normal daily activities. This is usually used to diagnose what is causing palpitations/syncope (passing out).    Follow-Up:  Your physician recommends that you schedule a follow-up appointment in: 6 weeks with Dr Rayann Heman   Any Other Special Instructions Will Be Listed Below (If Applicable).

## 2014-07-23 ENCOUNTER — Other Ambulatory Visit: Payer: Self-pay

## 2014-07-23 ENCOUNTER — Inpatient Hospital Stay (HOSPITAL_COMMUNITY)
Admission: EM | Admit: 2014-07-23 | Discharge: 2014-07-24 | DRG: 312 | Disposition: A | Discharge status: Home / Self Care | Payer: Commercial Managed Care - PPO | Attending: Interventional Cardiology | Admitting: Interventional Cardiology

## 2014-07-23 ENCOUNTER — Other Ambulatory Visit (HOSPITAL_COMMUNITY): Payer: Self-pay

## 2014-07-23 ENCOUNTER — Encounter (HOSPITAL_COMMUNITY): Payer: Self-pay | Admitting: Cardiology

## 2014-07-23 DIAGNOSIS — Z9049 Acquired absence of other specified parts of digestive tract: Secondary | ICD-10-CM | POA: Diagnosis present

## 2014-07-23 DIAGNOSIS — R42 Dizziness and giddiness: Secondary | ICD-10-CM | POA: Diagnosis present

## 2014-07-23 DIAGNOSIS — I471 Supraventricular tachycardia, unspecified: Secondary | ICD-10-CM | POA: Diagnosis present

## 2014-07-23 DIAGNOSIS — R55 Syncope and collapse: Secondary | ICD-10-CM | POA: Diagnosis present

## 2014-07-23 DIAGNOSIS — Z7982 Long term (current) use of aspirin: Secondary | ICD-10-CM

## 2014-07-23 DIAGNOSIS — K219 Gastro-esophageal reflux disease without esophagitis: Secondary | ICD-10-CM | POA: Diagnosis present

## 2014-07-23 DIAGNOSIS — I10 Essential (primary) hypertension: Secondary | ICD-10-CM | POA: Diagnosis present

## 2014-07-23 DIAGNOSIS — Z9861 Coronary angioplasty status: Secondary | ICD-10-CM

## 2014-07-23 DIAGNOSIS — E785 Hyperlipidemia, unspecified: Secondary | ICD-10-CM | POA: Diagnosis present

## 2014-07-23 DIAGNOSIS — G4733 Obstructive sleep apnea (adult) (pediatric): Secondary | ICD-10-CM | POA: Diagnosis present

## 2014-07-23 DIAGNOSIS — G8929 Other chronic pain: Secondary | ICD-10-CM | POA: Diagnosis present

## 2014-07-23 DIAGNOSIS — R0989 Other specified symptoms and signs involving the circulatory and respiratory systems: Secondary | ICD-10-CM | POA: Diagnosis not present

## 2014-07-23 DIAGNOSIS — R0789 Other chest pain: Secondary | ICD-10-CM | POA: Diagnosis present

## 2014-07-23 DIAGNOSIS — R079 Chest pain, unspecified: Secondary | ICD-10-CM | POA: Diagnosis present

## 2014-07-23 DIAGNOSIS — I2510 Atherosclerotic heart disease of native coronary artery without angina pectoris: Secondary | ICD-10-CM | POA: Diagnosis present

## 2014-07-23 DIAGNOSIS — Z87891 Personal history of nicotine dependence: Secondary | ICD-10-CM

## 2014-07-23 HISTORY — DX: Syncope and collapse: R55

## 2014-07-23 LAB — CBC
HCT: 41.1 % (ref 39.0–52.0)
Hemoglobin: 14.1 g/dL (ref 13.0–17.0)
MCH: 30.4 pg (ref 26.0–34.0)
MCHC: 34.3 g/dL (ref 30.0–36.0)
MCV: 88.6 fL (ref 78.0–100.0)
Platelets: 257 10*3/uL (ref 150–400)
RBC: 4.64 MIL/uL (ref 4.22–5.81)
RDW: 12.4 % (ref 11.5–15.5)
WBC: 6.8 10*3/uL (ref 4.0–10.5)

## 2014-07-23 LAB — I-STAT CHEM 8, ED
BUN: 10 mg/dL (ref 6–20)
Calcium, Ion: 1.26 mmol/L (ref 1.13–1.30)
Chloride: 102 mmol/L (ref 101–111)
Creatinine, Ser: 1 mg/dL (ref 0.61–1.24)
Glucose, Bld: 97 mg/dL (ref 65–99)
HCT: 44 % (ref 39.0–52.0)
Hemoglobin: 15 g/dL (ref 13.0–17.0)
Potassium: 4.3 mmol/L (ref 3.5–5.1)
Sodium: 138 mmol/L (ref 135–145)
TCO2: 24 mmol/L (ref 0–100)

## 2014-07-23 LAB — I-STAT TROPONIN, ED: Troponin i, poc: 0.01 ng/mL (ref 0.00–0.08)

## 2014-07-23 LAB — MAGNESIUM: Magnesium: 2.1 mg/dL (ref 1.7–2.4)

## 2014-07-23 LAB — TROPONIN I
Troponin I: <0.03 ng/mL (ref <0.031)
Troponin I: <0.03 ng/mL (ref <0.031)

## 2014-07-23 LAB — HEPARIN LEVEL (UNFRACTIONATED): Heparin Unfractionated: 0.51 IU/mL (ref 0.30–0.70)

## 2014-07-23 MED ORDER — SODIUM CHLORIDE 0.9 % IV SOLN
INTRAVENOUS | Status: DC
  Administered 2014-07-23: 21:00:00 via INTRAVENOUS

## 2014-07-23 MED ORDER — ONDANSETRON HCL 4 MG/2ML IJ SOLN: 4.0000 mg | Freq: Four times a day (QID) | INTRAMUSCULAR | Status: DC | PRN

## 2014-07-23 MED ORDER — ASPIRIN EC 81 MG PO TBEC: 81.0000 mg | DELAYED_RELEASE_TABLET | Freq: Every day | ORAL | Status: DC

## 2014-07-23 MED ORDER — PANTOPRAZOLE SODIUM 40 MG PO TBEC
80.0000 mg | DELAYED_RELEASE_TABLET | Freq: Every day | ORAL | Status: DC
  Administered 2014-07-24: 80 mg via ORAL
  Filled 2014-07-23 (×2): qty 2

## 2014-07-23 MED ORDER — ALPRAZOLAM 0.25 MG PO TABS: 0.2500 mg | ORAL_TABLET | Freq: Two times a day (BID) | ORAL | Status: DC | PRN

## 2014-07-23 MED ORDER — ASPIRIN EC 81 MG PO TBEC
81.0000 mg | DELAYED_RELEASE_TABLET | Freq: Every day | ORAL | Status: DC
  Administered 2014-07-24: 81 mg via ORAL
  Filled 2014-07-23: qty 1

## 2014-07-23 MED ORDER — ACETAMINOPHEN 325 MG PO TABS: 650.0000 mg | ORAL_TABLET | ORAL | Status: DC | PRN

## 2014-07-23 MED ORDER — ROSUVASTATIN CALCIUM 10 MG PO TABS
10.0000 mg | ORAL_TABLET | Freq: Every evening | ORAL | Status: DC
  Administered 2014-07-23: 10 mg via ORAL
  Filled 2014-07-23: qty 1

## 2014-07-23 MED ORDER — ADULT MULTIVITAMIN W/MINERALS CH
1.0000 | ORAL_TABLET | Freq: Every day | ORAL | Status: DC
  Administered 2014-07-24: 1 via ORAL
  Filled 2014-07-23 (×3): qty 1

## 2014-07-23 MED ORDER — HEPARIN BOLUS VIA INFUSION
4000.0000 [IU] | Freq: Once | INTRAVENOUS | Status: AC
  Administered 2014-07-23: 4000 [IU] via INTRAVENOUS
  Filled 2014-07-23: qty 4000

## 2014-07-23 MED ORDER — HEPARIN (PORCINE) IN NACL 100-0.45 UNIT/ML-% IJ SOLN
1150.0000 [IU]/h | INTRAMUSCULAR | Status: DC
  Administered 2014-07-23 – 2014-07-24 (×2): 1150 [IU]/h via INTRAVENOUS
  Filled 2014-07-23 (×3): qty 250

## 2014-07-23 MED ORDER — ASPIRIN 300 MG RE SUPP: 300.0000 mg | RECTAL | Status: DC

## 2014-07-23 MED ORDER — NITROGLYCERIN 0.4 MG SL SUBL: 0.4000 mg | SUBLINGUAL_TABLET | SUBLINGUAL | Status: DC | PRN

## 2014-07-23 MED ORDER — ASPIRIN 81 MG PO CHEW: 324.0000 mg | CHEWABLE_TABLET | ORAL | Status: DC

## 2014-07-23 MED ORDER — DILTIAZEM HCL ER 180 MG PO CP24
180.0000 mg | ORAL_CAPSULE | Freq: Two times a day (BID) | ORAL | Status: DC
  Administered 2014-07-23 – 2014-07-24 (×2): 180 mg via ORAL
  Filled 2014-07-23 (×5): qty 1

## 2014-07-23 NOTE — ED Provider Notes (Signed)
CSN: [id number]     Arrival date & time 07/23/14  1552 History   First MD Initiated Contact with Patient 07/23/14 0940     Chief Complaint  Patient presents with  . Chest Pain  . Palpitations  . Near Syncope     (Consider location/radiation/quality/duration/timing/severity/associated sxs/prior Treatment) HPI  David [name] is a 64 y.o. male with PMH of CAD with nonobstructive cath in 2009, PSVT on diltizem, OSA, HTN presenting with 40 min episode of lightheadedness better with lying on the floor. This occurred around 8am. Pt also had central chest discomfort described a a "slight squeeze" that has resolved at present. He denies exertional symptoms. Pt has had this similar sensation on going for the past month. Pt seen by Dr. Rayann Heman yesterday and myoview and carotid dopplers ordered for next Friday as well as monitor to be placed. No nausea, vomiting, diaphoresis. Pt was given 324 aspirin by EMS.    Past Medical History  Diagnosis Date  . OSA (obstructive sleep apnea)     BIPAP  . Knee pain, right     PARTICULARY AT NIGHT  . Back pain, chronic   . GERD (gastroesophageal reflux disease)   . Hyperlipidemia     INTOLERANT OF STATINS  . Palpitations   . History of PSVT (paroxysmal supraventricular tachycardia) 2009    nonsustained atach per Dr Marlou Porch on monitor in 2009. treated with diltiazem  . Chronic low back pain   . Chronic knee pain   . CAD (coronary artery disease) 2009    nonobstructive by cath  . Near syncope 07/23/2014   Past Surgical History  Procedure Laterality Date  . Knee arthroscopy Right 1982  . Vasectomy  1980  . Ercp  2006  . Laparoscopic cholecystectomy  2006  . Colonoscopy      2003, 2008, 2013  . Coronary angioplasty  07/2007    NONOBSTRUCTIVE CORONARY PLAQUING- MINOR 20 % OSTIAL DIAGONAL STENOSIS, NORMAL LEFT VENTRICULAR EF 55%  . Cholecystectomy     Family History  Problem Relation Age of Onset  . Heart attack Mother 41  . CVA Mother 6    AND AGAIN  AT 67  . Colon cancer Father   . Pneumonia Father     WITH SEPSIS  . Heart attack Father   . Diabetes Maternal Grandmother   . Hyperlipidemia Sister   . Hypertension Sister   . Stroke Brother 10  . Hypertension Brother   . Dysrhythmia Brother     and DIZZINESS  . GER disease Sister    History  Substance Use Topics  . Smoking status: Former Smoker -- 2.50 packs/day    Types: Cigarettes    Quit date: 02/20/2000  . Smokeless tobacco: Not on file  . Alcohol Use: Yes    Review of Systems 10 Systems reviewed and are negative for acute change except as noted in the HPI.    Allergies  Review of patient's allergies indicates no known allergies.  Home Medications   Prior to Admission medications   Medication Sig Start Date End Date Taking? Authorizing Provider  aspirin EC 81 MG tablet Take 81 mg by mouth daily.   Yes Historical Provider, MD  cyclobenzaprine (FLEXERIL) 5 MG tablet Take 5 mg by mouth 3 (three) times daily as needed for muscle spasms. PT. HAS NOT STARTED THIS MEDICATION YET (07/22/14) 07/05/14  Yes Historical Provider, MD  diltiazem (DILACOR XR) 180 MG 24 hr capsule Take 180 mg by mouth 2 (two) times daily.  Yes Historical Provider, MD  esomeprazole (NEXIUM) 40 MG capsule Take 40 mg by mouth daily at 12 noon.   Yes Historical Provider, MD  ibuprofen (ADVIL,MOTRIN) 400 MG tablet Take 800 mg by mouth every 6 (six) hours as needed for mild pain.   Yes Historical Provider, MD  Multiple Vitamins-Minerals (MULTIVITAMIN WITH MINERALS) tablet Take 1 tablet by mouth daily.   Yes Historical Provider, MD  rosuvastatin (CRESTOR) 10 MG tablet Take 10 mg by mouth every evening.    Yes Historical Provider, MD   BP 146/80 mmHg  Pulse 65  Temp(Src) 98.9 F (37.2 C) (Oral)  Resp 16  Wt 191 lb (86.637 kg)  SpO2 95% Physical Exam  Constitutional: He is oriented to person, place, and time. He appears well-developed and well-nourished. No distress.  HENT:  Head: Normocephalic and  atraumatic.  Eyes: Conjunctivae and EOM are normal. Pupils are equal, round, and reactive to light. Right eye exhibits no discharge. Left eye exhibits no discharge.  Neck: No JVD present.  Cardiovascular: Normal rate and regular rhythm.   No leg swelling or tenderness. Negative Homan's sign.  Pulmonary/Chest: Effort normal and breath sounds normal. No respiratory distress. He has no wheezes.  Abdominal: Soft. Bowel sounds are normal. He exhibits no distension. There is no tenderness.  Neurological: He is alert and oriented to person, place, and time. No cranial nerve deficit. He exhibits normal muscle tone. Coordination normal.  Speech is clear and goal oriented. Strength 5/5 in upper and lower extremities. Sensation intact. Intact rapid alternating movements, finger to nose, and heel to shin.  No pronator drift. Normal gait.  Skin: Skin is warm and dry. He is not diaphoretic.  Nursing note and vitals reviewed.   ED Course  Procedures (including critical care time) Labs Review Labs Reviewed  CBC  TROPONIN I  TROPONIN I  TROPONIN I  MAGNESIUM  I-STAT CHEM 8, ED  I-STAT TROPOININ, ED    Imaging Review No results found.   EKG Interpretation None      MDM   Final diagnoses:  Near syncope  Chest pain, unspecified chest pain type   Shunting with near syncopal event for about 30-40 minutes with no symptoms at present. Patient with cardiac history of SVT and nonocclusive CAD. VSS. Neurological exam without deficits. Regular rate and rhythm and EKG without evidence of ischemia. Negative troponin. No electrolyte abnormalities. No anemia. Patient is not orthostatic. Consult to cardiology. Spoke with Wannetta Sender who had patient evaluated here in the ED. Patient in no acute distress at present. Denies chest pain. Awaiting cardiology's recommendations and disposition accordingly.  Plan for admission for further work up.  Discussed all results and patient verbalizes understanding and agrees  with plan.  This is a shared patient. This patient was discussed with the physician who saw and evaluated the patient and agrees with the plan.  Filed Vitals:   07/23/14 1300 07/23/14 1315 07/23/14 1330 07/23/14 1345  BP: 132/78 161/78 147/80 146/80  Pulse: 65 62 66 65  Temp:      TempSrc:      Resp: 19 14 13 16   Weight:      SpO2: 96% 97% 95% 95%     Al Corpus, PA-C 07/23/14 [place], MD 07/23/14 1644

## 2014-07-23 NOTE — Progress Notes (Signed)
ANTICOAGULATION CONSULT NOTE - Initial Consult  Pharmacy Consult for heparin Indication: chest pain/ACS  No Known Allergies  Patient Measurements: Weight: 191 lb (86.637 kg) Heparin Dosing Weight: 87kg  Vital Signs: Temp: 98.9 F (37.2 C) (06/03 0939) Temp Source: Oral (06/03 0939) BP: 138/87 mmHg (06/03 1600) Pulse Rate: 61 (06/03 1600)  Labs:  Recent Labs  07/23/14 1037 07/23/14 1057 07/23/14 1443  HGB 14.1 15.0  --   HCT 41.1 44.0  --   PLT 257  --   --   CREATININE  --  1.00  --   TROPONINI  --   --  <0.03    Estimated Creatinine Clearance: 81.8 mL/min (by C-G formula based on Cr of 1).   Medical History: Past Medical History  Diagnosis Date  . OSA (obstructive sleep apnea)     BIPAP  . Knee pain, right     PARTICULARY AT NIGHT  . Back pain, chronic   . GERD (gastroesophageal reflux disease)   . Hyperlipidemia     INTOLERANT OF STATINS  . Palpitations   . History of PSVT (paroxysmal supraventricular tachycardia) 2009    nonsustained atach per Dr Marlou Porch on monitor in 2009. treated with diltiazem  . Chronic low back pain   . Chronic knee pain   . CAD (coronary artery disease) 2009    nonobstructive by cath  . Near syncope 07/23/2014    Assessment: 27 yom to ED with syncope x 1 mo, SOB, chest discomfort. Pharmacy consulted to dose heparin for CP/ACS. CBC stable wnl. No bleed documented.  Goal of Therapy:  Heparin level 0.3-0.7 units/ml Monitor platelets by anticoagulation protocol: Yes   Plan:  Heparin 4000 bolus units Heparin IV @ 1150 units/hr 6h HL Daily HL/CBC Mon s/sx bleed  Elicia Lamp, PharmD Clinical Pharmacist - Resident Pager [phone] 07/23/2014 4:18 PM

## 2014-07-23 NOTE — ED Notes (Addendum)
Pt to department via EMS reports that he got to work today and felt like he was going to pass out. Did have have any actual LOC. Reports he has been having some chest pressure also and has been having this episodes intermittently for the past month. Reports he was placed Cardizem back in May by Cardiology for PSVT. Bp-146/92 Hr-74 NSR. 98%-RA Reports he is going to cardiology to have a monitor placed on the 10th. 324 ASA 18g LAC.

## 2014-07-23 NOTE — ED Provider Notes (Signed)
Patient became lightheaded this morning while sitting at his desk 8:05 AM. Lightheadedness last for 30 minutes, resolve spontaneously. Symptoms accompanied by very mild anterior chest pressure. Nothing made symptoms better or worse. He is no longer lightheaded. He does complain of mild chest discomfort presently. Seen by his cardiologist yesterday. Cardiac monitor and carotid Doppler scheduled as outpatient for next week, as result of yesterday's office visit. On exam no distress lungs clear auscultation heart regular rate and rhythm abdomen nondistended nontender extremities edema. He is not lightheaded on standing.  Date: 07/23/2014  Rate:65  Rhythm: normal sinus rhythm  QRS Axis: normal  Intervals: normal  ST/T Wave abnormalities: normal  Conduction Disutrbances: none  Narrative Interpretation: unremarkable     Orlie Dakin, MD 07/23/14 1021

## 2014-07-23 NOTE — Progress Notes (Signed)
ANTICOAGULATION CONSULT NOTE - Follow Up Consult  Pharmacy Consult for heparin Indication: chest pain/ACS   Labs:  Recent Labs  07/23/14 1037 07/23/14 1057 07/23/14 1443 07/23/14 2031 07/23/14 2248  HGB 14.1 15.0  --   --   --   HCT 41.1 44.0  --   --   --   PLT 257  --   --   --   --   HEPARINUNFRC  --   --   --   --  0.51  CREATININE  --  1.00  --   --   --   TROPONINI  --   --  <0.03 <0.03  --       Assessment/Plan:  64yo male therapeutic on heparin with initial dosing for CP. Will continue gtt at current rate and confirm stable with am labs.   Wynona Neat, PharmD, BCPS  07/23/2014,11:52 PM

## 2014-07-23 NOTE — H&P (Signed)
David [name] is an 64 y.o. male.    Primary Cardiologist: dr. Marlou Porch in 2009,  Dr.  Rayann Heman yesterday  SPQ:ZRAQT,MAUQJF Sheppard Coil, MD  Chief Complaint: presented to ER this am with near syncope.  But has had symptoms intermittently since last month.  Though has never felt as bad as today.  He saw Dr. Rayann Heman yesterday.  HPI:   David [name] is a 64 y.o. male who presented yesterday 07/22/14 for electrophysiology evaluation. He presented for further management of dizziness, SOB and chest discomfort. He reports having palpitations and dizziness for "years". He was evaluated by Dr Marlou Porch in 2009 and found to have nonsustained atach. He was placed on diltiazem and did very well until the last few months. Recently, he has developed recurrent symptoms of dizziness. He has not had palpitations with this. He was seen by Dr Alyson Ingles and his diltiazem was increased without improvement. He describes his symptoms as a "head spinning" but does not feel that these are vertiginous in etiology. He denies recent URI symptoms, tinnitus, or a positional component. He has not passed out. These episodes have occurred several times per month over the past 3 months. He is unaware of triggers/ precipitants.  He also reported that 3 nights ago that he had SOB with chest tightness and pain radiating into his neck. This occurred at night and lasted about an hour and resolved with sleeping.  Dr. Rayann Heman ordered carotid dopplers for rt carotid bruit, event monitor for n30 days, and GXT myoview.  Plan was to continue dilt.  Orthostatic BPs were normal.      Today pt was at work- he felt well on rising and no problems, his wife is driving him because of lightheaded symptoms.   He arrived at work and drank water, was then sitting at his desk and felt very lightheaded like he would go out any time.  Just prior to this he had diaphramatic discomfort and mild nausea.  He did have minimal mid sternal discomfort  throughout.  None now.  He laid down and put his feet up and felt better, tried to sit up but symptoms returned.  His wife had been called and she checked his BP it was elevated 180/81 but his HR on device was 165.  Another time His HR was normal.  The symptoms lasted 20 min straight.  EMS was called and pt brought to ER here EKG stable SR without acute changes from yesterday.  He has never had an episode like this.    Orthostatic BP checks were normal lying  144/76, sitting 136/82, and standing 153/85.  Pulse remained normal at 66-74.  On monitor with my exam he would have occ PVCs, but only isolated.  K+ stable at 4.3, troponin 0.01 glucose 97.  He still has feelings of lightheadedness.  He took his AM meds today.       Past Medical History  Diagnosis Date  . OSA (obstructive sleep apnea)     BIPAP  . Knee pain, right     PARTICULARY AT NIGHT  . Back pain, chronic   . GERD (gastroesophageal reflux disease)   . Hyperlipidemia     INTOLERANT OF STATINS  . Palpitations   . History of PSVT (paroxysmal supraventricular tachycardia) 2009    nonsustained atach per Dr Marlou Porch on monitor in 2009. treated with diltiazem  . Chronic low back pain   . Chronic knee pain   . CAD (coronary artery disease) 2009  nonobstructive by cath    Past Surgical History  Procedure Laterality Date  . Knee arthroscopy Right 1982  . Vasectomy  1980  . Ercp  2006  . Laparoscopic cholecystectomy  2006  . Colonoscopy      2003, 2008, 2013  . Coronary angioplasty  07/2007    NONOBSTRUCTIVE CORONARY PLAQUING- MINOR 20 % OSTIAL DIAGONAL STENOSIS, NORMAL LEFT VENTRICULAR EF 55%  . Cholecystectomy      Family History  Problem Relation Age of Onset  . Heart attack Mother 54  . CVA Mother 6    AND AGAIN AT 15  . Colon cancer Father   . Pneumonia Father     WITH SEPSIS  . Heart attack Father   . Diabetes Maternal Grandmother   . Hyperlipidemia Sister   . Hypertension Sister   . Stroke Brother 36  .  Hypertension Brother   . Dysrhythmia Brother     and DIZZINESS  . GER disease Sister    Social History:  reports that he quit smoking about 14 years ago. His smoking use included Cigarettes. He smoked 2.50 packs per day. He does not have any smokeless tobacco history on file. He reports that he drinks alcohol. His drug history is not on file.  Allergies: No Known Allergies  OUTPATIENT MEDS: No current facility-administered medications on file prior to encounter.   Current Outpatient Prescriptions on File Prior to Encounter  Medication Sig Dispense Refill  . aspirin EC 81 MG tablet Take 81 mg by mouth daily.    . cyclobenzaprine (FLEXERIL) 5 MG tablet Take 5 mg by mouth 3 (three) times daily as needed for muscle spasms. PT. HAS NOT STARTED THIS MEDICATION YET (07/22/14)  0  . diltiazem (DILACOR XR) 180 MG 24 hr capsule Take 180 mg by mouth 2 (two) times daily.     Marland Kitchen esomeprazole (NEXIUM) 40 MG capsule Take 40 mg by mouth daily at 12 noon.    . Multiple Vitamins-Minerals (MULTIVITAMIN WITH MINERALS) tablet Take 1 tablet by mouth daily.    . rosuvastatin (CRESTOR) 10 MG tablet Take 10 mg by mouth every evening.        Results for orders placed or performed during the hospital encounter of 07/23/14 (from the past 48 hour(s))  CBC     Status: None   Collection Time: 07/23/14 10:37 AM  Result Value Ref Range   WBC 6.8 4.0 - 10.5 K/uL   RBC 4.64 4.22 - 5.81 MIL/uL   Hemoglobin 14.1 13.0 - 17.0 g/dL   HCT 41.1 39.0 - 52.0 %   MCV 88.6 78.0 - 100.0 fL   MCH 30.4 26.0 - 34.0 pg   MCHC 34.3 30.0 - 36.0 g/dL   RDW 12.4 11.5 - 15.5 %   Platelets 257 150 - 400 K/uL  I-Stat Troponin, ED (not at [facility])     Status: None   Collection Time: 07/23/14 10:56 AM  Result Value Ref Range   Troponin i, poc 0.01 0.00 - 0.08 ng/mL   Comment 3            Comment: Due to the release kinetics of cTnI, a negative result within the first hours of the onset of symptoms does not rule out myocardial infarction  with certainty. If myocardial infarction is still suspected, repeat the test at appropriate intervals.   I-Stat Chem 8, ED     Status: None   Collection Time: 07/23/14 10:57 AM  Result Value Ref Range   Sodium 138 135 -  145 mmol/L   Potassium 4.3 3.5 - 5.1 mmol/L   Chloride 102 101 - 111 mmol/L   BUN 10 6 - 20 mg/dL   Creatinine, Ser 1.00 0.61 - 1.24 mg/dL   Glucose, Bld 97 65 - 99 mg/dL   Calcium, Ion 1.26 1.13 - 1.30 mmol/L   TCO2 24 0 - 100 mmol/L   Hemoglobin 15.0 13.0 - 17.0 g/dL   HCT 44.0 39.0 - 52.0 %   No results found.  ROS: General:no colds or fevers, no weight changes Skin:no rashes or ulcers HEENT:no blurred vision, no congestion CV:see HPI PUL:see HPI GI:no diarrhea constipation or melena, no indigestion GU:no hematuria, no dysuria MS:no joint pain, no claudication Neuro:+ near syncope, + lightheadedness Endo:no diabetes, no thyroid disease   Blood pressure 131/84, pulse 66, temperature 98.9 F (37.2 C), temperature source Oral, resp. rate 14, weight 191 lb (86.637 kg), SpO2 95 %. PE: General:Pleasant affect, NAD Skin:Warm and dry, brisk capillary refill HEENT:normocephalic, sclera clear, mucus membranes moist Neck:supple, no JVD, + rt carotid bruit none on Lt  Heart:S1S2 RRR without murmur, gallup, rub or click Lungs:clear without rales, rhonchi, or wheezes WSF:KCLE, non tender, + BS, do not palpate liver spleen or masses Ext:no lower ext edema, 2+ pedal pulses, 2+ radial pulses Neuro:alert and oriented X 3, MAE, follows commands, + facial symmetry, very concerned about episodes.    Assessment/Plan Principal Problem:   Near syncope, this was much worse than his previous episodes of lightheadedness.  He was sitting when occurred and lasted for 20 min of lightheadedness along with some chest discomfort,  Began as nausea and diaphragmatic discomfort.  Admit monitor check carotid dopplers, and serial troponins.  Possible cath vs. ETT myoview depending or  troponins. BP monitor noted HR of 165.  MD to see.   Active Problems:   Carotid bruit- rt check dopplers   Chest discomfort- begin heparin and serial troponins   SVT (supraventricular tachycardia)- hx of in 2009 and on dilt without any problems until mid MAY,  Has not driven since then due to lightheaded symptoms.     [place] Nurse Practitioner Certified [place] Pager [phone] or after 5pm or weekends call [phone] 07/23/2014, 12:53 PM

## 2014-07-24 ENCOUNTER — Inpatient Hospital Stay (HOSPITAL_COMMUNITY): Payer: Commercial Managed Care - PPO

## 2014-07-24 ENCOUNTER — Other Ambulatory Visit: Payer: Self-pay | Admitting: Cardiology

## 2014-07-24 ENCOUNTER — Other Ambulatory Visit: Payer: Self-pay

## 2014-07-24 DIAGNOSIS — R0989 Other specified symptoms and signs involving the circulatory and respiratory systems: Secondary | ICD-10-CM

## 2014-07-24 DIAGNOSIS — R55 Syncope and collapse: Secondary | ICD-10-CM

## 2014-07-24 DIAGNOSIS — R0789 Other chest pain: Secondary | ICD-10-CM

## 2014-07-24 LAB — CBC
HCT: 40.1 % (ref 39.0–52.0)
Hemoglobin: 13.5 g/dL (ref 13.0–17.0)
MCH: 29.9 pg (ref 26.0–34.0)
MCHC: 33.7 g/dL (ref 30.0–36.0)
MCV: 88.9 fL (ref 78.0–100.0)
Platelets: 248 10*3/uL (ref 150–400)
RBC: 4.51 MIL/uL (ref 4.22–5.81)
RDW: 12.5 % (ref 11.5–15.5)
WBC: 7.9 10*3/uL (ref 4.0–10.5)

## 2014-07-24 LAB — BASIC METABOLIC PANEL
Anion gap: 9 (ref 5–15)
BUN: 9 mg/dL (ref 6–20)
CO2: 26 mmol/L (ref 22–32)
Calcium: 9.3 mg/dL (ref 8.9–10.3)
Chloride: 103 mmol/L (ref 101–111)
Creatinine, Ser: 1 mg/dL (ref 0.61–1.24)
GFR calc Af Amer: >60 mL/min (ref >60)
GFR calc non Af Amer: >60 mL/min (ref >60)
Glucose, Bld: 107 mg/dL — ABNORMAL HIGH (ref 65–99)
Potassium: 4.2 mmol/L (ref 3.5–5.1)
Sodium: 138 mmol/L (ref 135–145)

## 2014-07-24 LAB — NM MYOCAR MULTI W/SPECT W/WALL MOTION / EF
LV dias vol: 112 mL
LV sys vol: 54 mL
Nuc Stress EF: 57 %
RATE: 0.4
SDS: 0
SRS: 1
SSS: 1
TID: 0.87

## 2014-07-24 LAB — GLUCOSE, CAPILLARY: Glucose-Capillary: 104 mg/dL — ABNORMAL HIGH (ref 65–99)

## 2014-07-24 LAB — TROPONIN I: Troponin I: <0.03 ng/mL (ref <0.031)

## 2014-07-24 LAB — HEPARIN LEVEL (UNFRACTIONATED): Heparin Unfractionated: 0.6 IU/mL (ref 0.30–0.70)

## 2014-07-24 MED ORDER — TECHNETIUM TC 99M SESTAMIBI GENERIC - CARDIOLITE
10.0000 | Freq: Once | INTRAVENOUS | Status: AC | PRN
  Administered 2014-07-24: 10 via INTRAVENOUS

## 2014-07-24 MED ORDER — REGADENOSON 0.4 MG/5ML IV SOLN
INTRAVENOUS | Status: AC
  Filled 2014-07-24: qty 5

## 2014-07-24 MED ORDER — TECHNETIUM TC 99M SESTAMIBI - CARDIOLITE
30.0000 | Freq: Once | INTRAVENOUS | Status: AC | PRN
  Administered 2014-07-24: 10:00:00 30 via INTRAVENOUS

## 2014-07-24 NOTE — Progress Notes (Signed)
Pt alert and oriented x 3 Skin warm and dry. Complain of headache, but refuses pain medication.  Bath completed this am by patient.

## 2014-07-24 NOTE — Progress Notes (Signed)
ANTICOAGULATION CONSULT NOTE - Initial Consult  Pharmacy Consult for heparin Indication: chest pain/ACS  No Known Allergies  Patient Measurements: Weight: 190 lb 12.8 oz (86.546 kg) Heparin Dosing Weight: 87kg  Vital Signs: Temp: 98.3 F (36.8 C) (06/04 0500) Temp Source: Oral (06/04 0500) BP: 116/66 mmHg (06/04 0500) Pulse Rate: 60 (06/03 2009)  Labs:  Recent Labs  07/23/14 1037 07/23/14 1057 07/23/14 1443 07/23/14 2031 07/23/14 2248 07/24/14 0046 07/24/14 0520  HGB 14.1 15.0  --   --   --  13.5  --   HCT 41.1 44.0  --   --   --  40.1  --   PLT 257  --   --   --   --  248  --   HEPARINUNFRC  --   --   --   --  0.51  --  0.60  CREATININE  --  1.00  --   --   --  1.00  --   TROPONINI  --   --  <0.03 <0.03  --  <0.03  --     Estimated Creatinine Clearance: 81.8 mL/min (by C-G formula based on Cr of 1).   Medical History: Past Medical History  Diagnosis Date  . OSA (obstructive sleep apnea)     BIPAP  . Knee pain, right     PARTICULARY AT NIGHT  . Back pain, chronic   . GERD (gastroesophageal reflux disease)   . Hyperlipidemia     INTOLERANT OF STATINS  . Palpitations   . History of PSVT (paroxysmal supraventricular tachycardia) 2009    nonsustained atach per Dr Marlou Porch on monitor in 2009. treated with diltiazem  . Chronic low back pain   . Chronic knee pain   . CAD (coronary artery disease) 2009    nonobstructive by cath  . Near syncope 07/23/2014    Assessment: 81 yom to ED with syncope x 1 mo, SOB, chest discomfort. Pharmacy consulted to dose heparin for CP/ACS. Heparin level this am remains therapeutic at 0.60. CBC stable wnl. No bleeding documented.  Goal of Therapy:  Heparin level 0.3-0.7 units/ml Monitor platelets by anticoagulation protocol: Yes   Plan:  Continue heparin IV @ 1150 units/hr Daily HL/CBC Monitor s/sx bleed F/u plans for cath  [facility] K. Velva Harman, PharmD, [place] Clinical Pharmacist - Resident Pager: [phone] Pharmacy:  [phone] 07/24/2014 7:58 AM

## 2014-07-24 NOTE — Progress Notes (Signed)
Patient Profile: 64 y/o male admitted for chest pain and near syncope.   Subjective: Feels fine today. No recurrent symptoms. Tolerated exercise stress test well w/o exertional angina.   Objective: Vital signs in last 24 hours: Temp:  [97.6 F (36.4 C)-98.4 F (36.9 C)] 98.4 F (36.9 C) (06/04 1339) Pulse Rate:  [58-67] 66 (06/04 1339) Resp:  [15-19] 16 (06/04 1339) BP: (111-170)/(64-91) 116/70 mmHg (06/04 1339) SpO2:  [93 %-97 %] 96 % (06/04 1339) Weight:  [190 lb 12.8 oz (86.546 kg)] 190 lb 12.8 oz (86.546 kg) (06/04 0500) Last BM Date: 07/23/14  Intake/Output from previous day: 06/03 0701 - 06/04 0700 In: 257.8 [I.V.:257.8] Out: -  Intake/Output this shift: Total I/O In: 360 [P.O.:360] Out: -   Medications Current Facility-Administered Medications  Medication Dose Route Frequency Provider Last Rate Last Dose  . 0.9 %  sodium chloride infusion   Intravenous Continuous Isaiah Serge, NP 10 mL/hr at 07/23/14 2038    . acetaminophen (TYLENOL) tablet 650 mg  650 mg Oral Q4H PRN Isaiah Serge, NP      . ALPRAZolam Duanne Moron) tablet 0.25 mg  0.25 mg Oral BID PRN Isaiah Serge, NP      . aspirin chewable tablet 324 mg  324 mg Oral NOW Isaiah Serge, NP   324 mg at 07/23/14 1945   Or  . aspirin suppository 300 mg  300 mg Rectal NOW Isaiah Serge, NP      . aspirin EC tablet 81 mg  81 mg Oral Daily Isaiah Serge, NP   81 mg at 07/24/14 1100  . diltiazem (DILACOR XR) 24 hr capsule 180 mg  180 mg Oral BID Isaiah Serge, NP   180 mg at 07/24/14 1100  . heparin ADULT infusion 100 units/mL (25000 units/250 mL)  1,150 Units/hr Intravenous Continuous Romona Curls, RPH 11.5 mL/hr at 07/24/14 1453 1,150 Units/hr at 07/24/14 1453  . multivitamin with minerals tablet 1 tablet  1 tablet Oral Daily Isaiah Serge, NP   1 tablet at 07/24/14 1100  . nitroGLYCERIN (NITROSTAT) SL tablet 0.4 mg  0.4 mg Sublingual Q5 Min x 3 PRN Isaiah Serge, NP      . ondansetron [facility]) injection 4 mg   4 mg Intravenous Q6H PRN Isaiah Serge, NP      . pantoprazole (PROTONIX) EC tablet 80 mg  80 mg Oral Q1200 Isaiah Serge, NP   80 mg at 07/24/14 1103  . rosuvastatin (CRESTOR) tablet 10 mg  10 mg Oral QPM Isaiah Serge, NP   10 mg at 07/23/14 2038    PE: General appearance: alert, cooperative and no distress Neck: no JVD and + right carotid bruit Lungs: clear to auscultation bilaterally Heart: regular rate and rhythm, S1, S2 normal, no murmur, click, rub or gallop Extremities: no LEE Pulses: 2+ and symmetric Skin: warm and dry Neurologic: Grossly normal  Lab Results:   Recent Labs  07/23/14 1037 07/23/14 1057 07/24/14 0046  WBC 6.8  --  7.9  HGB 14.1 15.0 13.5  HCT 41.1 44.0 40.1  PLT 257  --  248   BMET  Recent Labs  07/23/14 1057 07/24/14 0046  NA 138 138  K 4.3 4.2  CL 102 103  CO2  --  26  GLUCOSE 97 107*  BUN 10 9  CREATININE 1.00 1.00  CALCIUM  --  9.3   Cardiac Panel (last 3 results)  Recent Labs  07/23/14  1443 07/23/14 2031 07/24/14 0046  TROPONINI <0.03 <0.03 <0.03    Studies/Results: NST- 07/24/14 Nuclear Stress Findings     Isotope administration The isotope used for nuclear imaging was Tc48m Sestamibi. IV Site: left antecubital fossa.Rest isotope was administered on 07/24/2014 at 08:20Stress isotope was administered on 07/24/2014 at 10:00at peak stress Stress SPECT images were obtained approximately 30 minutes post tracer injection.    Nuclear Study Quality Overall image quality is good.    Nuclear Measurements Study was gated.    Study Impression Myocardial perfusion is normal. The study is normal. This is a low risk study. Overall left ventricular systolic function was normal. LV cavity size is normal. The left ventricular ejection fraction is normal (55-65%). Normal wall motion.     Carotid Dopplers - pending.    Assessment/Plan    Principal Problem:   Near syncope Active Problems:   Carotid bruit   Chest discomfort    SVT (supraventricular tachycardia)   Chest pain at rest   Cardiac enzymes negative x 3. NST performed today was negative or ischemia. EF normal at 55-60%. Vital signs stable. Carotid dopplers pending to evaluate carotid bruits. Continue ASA, statin and Cardizem.    LOS: 1 day    Brittainy M. Ladoris Gene 07/24/2014 3:13 PM   Patient seen and examined.  Review of telemetry shows sinus rhythm with occasional PVCs.  No SVT.  I think he can go home today and have the carotid Doppler as an outpatient.  His monitor is not scheduled until next Friday and he would twice a day if he could pick one up on Monday at the office.  Kerry Hough. MD [facility] 07/24/2014 4:50 PM

## 2014-07-24 NOTE — Discharge Summary (Signed)
Physician Discharge Summary  Patient ID: David [name] MRN: [id number] DOB/AGE: [birth year] 64 y.o.  Admit date: 07/23/2014 Discharge date: 07/24/2014   Primary Cardiologist: Dr. Marlou Porch Dr. Rayann Heman  Admission Diagnoses: Near Syncope  Discharge Diagnoses:  Principal Problem:   Near syncope Active Problems:   Carotid bruit   Chest discomfort   SVT (supraventricular tachycardia)   Chest pain at rest   Discharged Condition: stable  Hospital Course: The patient is a 64 y/o male who was admitted 07/23/14 for evaluation for near syncope and chest discomfort. He has had recurrent dizziness/near syncope for the past 6 weeks. He had a normal echo at [facility] 2 weeks ago. In addition to dizziness, there has been tightness in his chest and dyspnea. He has decrease in endurance x 6 months. Prior smoker. Has prior h/o PSVT. Initial  EKG on arrival was unremarkable. He was admitted to telemetry. Cardiac enzymes were negative x 3. NST was negative for ischemia. EF was normal. It was recommended that he be evaluated with an outpatient cardiac event monitor and outpatient  bilateral carotid dopplers given right sided bruit on exam. He was last seen and examined by Dr. Wynonia Lawman who determined he was stable for discharge home. F/u will be arranged.   Consults: None  Significant Diagnostic Studies: . Cardiac Panel (last 3 results)  Recent Labs  07/23/14 1443 07/23/14 2031 07/24/14 0046  TROPONINI <0.03 <0.03 <0.03   NST- 07/24/14 Nuclear Stress Findings     Isotope administration The isotope used for nuclear imaging was Tc32m Sestamibi. IV Site: left antecubital fossa.Rest isotope was administered on 07/24/2014 at 08:20Stress isotope was administered on 07/24/2014 at 10:00at peak stress Stress SPECT images were obtained approximately 30 minutes post tracer injection.    Nuclear Study Quality Overall image quality is good.    Nuclear Measurements Study was gated.    Study  Impression Myocardial perfusion is normal. The study is normal. This is a low risk study. Overall left ventricular systolic function was normal. LV cavity size is normal. The left ventricular ejection fraction is normal (55-65%). Normal wall motion.          Treatments: See Hospital Course  Discharge Exam: Blood pressure 116/70, pulse 66, temperature 98.4 F (36.9 C), temperature source Oral, resp. rate 16, height 5' 11.5" (1.816 m), weight 190 lb 12.8 oz (86.546 kg), SpO2 96 %.   Disposition:       Discharge Instructions    Diet - low sodium heart healthy    Complete by:  As directed      Increase activity slowly    Complete by:  As directed             Medication List    TAKE these medications        aspirin EC 81 MG tablet  Take 81 mg by mouth daily.     cyclobenzaprine 5 MG tablet  Commonly known as:  FLEXERIL  Take 5 mg by mouth 3 (three) times daily as needed for muscle spasms. PT. HAS NOT STARTED THIS MEDICATION YET (07/22/14)     diltiazem 180 MG 24 hr capsule  Commonly known as:  DILACOR XR  Take 180 mg by mouth 2 (two) times daily.     esomeprazole 40 MG capsule  Commonly known as:  NEXIUM  Take 40 mg by mouth daily at 12 noon.     ibuprofen 400 MG tablet  Commonly known as:  ADVIL,MOTRIN  Take 800 mg by mouth every 6 (six) hours  as needed for mild pain.     multivitamin with minerals tablet  Take 1 tablet by mouth daily.     rosuvastatin 10 MG tablet  Commonly known as:  CRESTOR  Take 10 mg by mouth every evening.       Follow-up Information    Follow up with Thompson Grayer, MD.   Specialty:  Cardiology   Why:  our office will call you with a follow-up and an appointment for heart monitor and neck ultrasound   Contact information:   [address] [place] [address] [phone]       TIME SPENT ON DISCHARGE, INCLUDING PHYSICIAN TIME: >30 MINUTES  Signed: Lyda Jester 07/24/2014, 4:58 PM  Agree with above stable  for discharge, see and examined earlier  W. Doristine Church. MD [facility] 07/24/2014

## 2014-07-26 ENCOUNTER — Telehealth: Payer: Self-pay | Admitting: Internal Medicine

## 2014-07-26 NOTE — Telephone Encounter (Signed)
New message      Give David [name] an update on his ER visit this weekend

## 2014-07-26 NOTE — Telephone Encounter (Signed)
Spoke with patient and let him know I would cancel his stress test as he had it at the hospital 6/4  He is scheduled to get the monitor on Fri and has a follow up scheduled which he will keep

## 2014-07-30 ENCOUNTER — Ambulatory Visit (INDEPENDENT_AMBULATORY_CARE_PROVIDER_SITE_OTHER): Payer: Commercial Managed Care - PPO

## 2014-07-30 ENCOUNTER — Ambulatory Visit (HOSPITAL_COMMUNITY): Payer: Commercial Managed Care - PPO | Attending: Internal Medicine

## 2014-07-30 DIAGNOSIS — Z87891 Personal history of nicotine dependence: Secondary | ICD-10-CM | POA: Insufficient documentation

## 2014-07-30 DIAGNOSIS — R42 Dizziness and giddiness: Secondary | ICD-10-CM | POA: Diagnosis not present

## 2014-07-30 DIAGNOSIS — I6523 Occlusion and stenosis of bilateral carotid arteries: Secondary | ICD-10-CM | POA: Diagnosis not present

## 2014-07-30 DIAGNOSIS — I2510 Atherosclerotic heart disease of native coronary artery without angina pectoris: Secondary | ICD-10-CM | POA: Insufficient documentation

## 2014-07-30 DIAGNOSIS — R002 Palpitations: Secondary | ICD-10-CM | POA: Diagnosis not present

## 2014-07-30 DIAGNOSIS — R55 Syncope and collapse: Secondary | ICD-10-CM | POA: Insufficient documentation

## 2014-07-30 DIAGNOSIS — R0989 Other specified symptoms and signs involving the circulatory and respiratory systems: Secondary | ICD-10-CM

## 2014-08-12 ENCOUNTER — Ambulatory Visit: Payer: Commercial Managed Care - PPO | Admitting: Cardiovascular Disease

## 2014-08-16 ENCOUNTER — Encounter (HOSPITAL_COMMUNITY): Payer: Commercial Managed Care - PPO

## 2014-08-17 ENCOUNTER — Encounter (HOSPITAL_COMMUNITY): Payer: Commercial Managed Care - PPO

## 2014-09-20 ENCOUNTER — Ambulatory Visit (INDEPENDENT_AMBULATORY_CARE_PROVIDER_SITE_OTHER): Payer: Commercial Managed Care - PPO | Admitting: Internal Medicine

## 2014-09-20 ENCOUNTER — Encounter: Payer: Self-pay | Admitting: Internal Medicine

## 2014-09-20 VITALS — BP 126/72 | HR 73 | Ht 71.25 in | Wt 187.4 lb

## 2014-09-20 DIAGNOSIS — R079 Chest pain, unspecified: Secondary | ICD-10-CM

## 2014-09-20 DIAGNOSIS — R55 Syncope and collapse: Secondary | ICD-10-CM

## 2014-09-20 DIAGNOSIS — R42 Dizziness and giddiness: Secondary | ICD-10-CM

## 2014-09-20 NOTE — Patient Instructions (Signed)
Medication Instructions:  Your physician recommends that you continue on your current medications as directed. Please refer to the Current Medication list given to you today.   Labwork: None ordered   Testing/Procedures: None ordered   Follow-Up: Your physician recommends that you schedule a follow-up appointment as needed    Any Other Special Instructions Will Be Listed Below (If Applicable).   

## 2014-09-20 NOTE — Progress Notes (Signed)
Electrophysiology Office Note   Date:  09/20/2014   ID:  David [name], DOB 10/03/1950, MRN [id number]  PCP:  Vena Austria, MD  Cardiologist:  Previously Dr Marlou Porch Primary Electrophysiologist: Thompson Grayer, MD   Chief Complaint  Patient presents with  . Palpitations  . Dizziness     History of Present Illness: David [name] is a 64 y.o. male who presents today for electrophysiology follow-up.  He presents for further management of dizziness, SOB and chest discomfort.  He reports having palpitations and dizziness for "years".  He was evaluated by Dr Marlou Porch in 2009 and found to have nonsustained atach.  His workup more recently has included an echo, myoview, carotid dopplers, and 30 day monitor.  These have been unrevealing.  His symptoms have somewhat improved.  He remains active.  Today, he denies symptoms of lower extremity edema, claudication,  syncope, bleeding, or neurologic sequela. The patient is tolerating medications without difficulties and is otherwise without complaint today.    Past Medical History  Diagnosis Date  . OSA (obstructive sleep apnea)     BIPAP  . Knee pain, right     PARTICULARY AT NIGHT  . Back pain, chronic   . GERD (gastroesophageal reflux disease)   . Hyperlipidemia     INTOLERANT OF STATINS  . Palpitations   . History of PSVT (paroxysmal supraventricular tachycardia) 2009    nonsustained atach per Dr Marlou Porch on monitor in 2009. treated with diltiazem  . Chronic low back pain   . Chronic knee pain   . CAD (coronary artery disease) 2009    nonobstructive by cath  . Near syncope 07/23/2014   Past Surgical History  Procedure Laterality Date  . Knee arthroscopy Right 1982  . Vasectomy  1980  . Ercp  2006  . Laparoscopic cholecystectomy  2006  . Colonoscopy      2003, 2008, 2013  . Coronary angioplasty  07/2007    NONOBSTRUCTIVE CORONARY PLAQUING- MINOR 20 % OSTIAL DIAGONAL STENOSIS, NORMAL LEFT VENTRICULAR EF 55%  . Cholecystectomy        Current Outpatient Prescriptions  Medication Sig Dispense Refill  . aspirin EC 81 MG tablet Take 81 mg by mouth daily.    Marland Kitchen diltiazem (DILACOR XR) 180 MG 24 hr capsule Take 180 mg by mouth 2 (two) times daily.     Marland Kitchen esomeprazole (NEXIUM) 40 MG capsule Take 40 mg by mouth daily at 12 noon.    Marland Kitchen ibuprofen (ADVIL,MOTRIN) 400 MG tablet Take 800 mg by mouth every 6 (six) hours as needed for mild pain.    . Multiple Vitamins-Minerals (MULTIVITAMIN WITH MINERALS) tablet Take 1 tablet by mouth daily.    . rosuvastatin (CRESTOR) 10 MG tablet Take 10 mg by mouth every evening.     . cyclobenzaprine (FLEXERIL) 5 MG tablet Take 5 mg by mouth 3 (three) times daily as needed for muscle spasms. PT. HAS NOT STARTED THIS MEDICATION YET (07/22/14)  0   No current facility-administered medications for this visit.    Allergies:   Review of patient's allergies indicates no known allergies.   Social History:  The patient  reports that he quit smoking about 14 years ago. His smoking use included Cigarettes. He smoked 2.50 packs per day. He does not have any smokeless tobacco history on file. He reports that he drinks alcohol.   Family History:  The patient's  family history includes CVA (age of onset: 32) in his mother; Colon cancer in his father; Diabetes in his  maternal grandmother; Dysrhythmia in his brother; GER disease in his sister; Heart attack in his father; Heart attack (age of onset: 56) in his mother; Hyperlipidemia in his sister; Hypertension in his brother and sister; Pneumonia in his father; Stroke (age of onset: 12) in his brother.    ROS:  Please see the history of present illness.   All other systems are reviewed and negative.    PHYSICAL EXAM: VS:  BP 126/72 mmHg  Pulse 73  Ht 5' 11.25" (1.81 m)  Wt 85.004 kg (187 lb 6.4 oz)  BMI 25.95 kg/m2 , BMI Body mass index is 25.95 kg/(m^2). GEN: Well nourished, well developed, in no acute distress HEENT: normal Neck: no JVD, + R carotid  bruit Cardiac: RRR; no murmurs, rubs, or gallops,no edema  Respiratory:  clear to auscultation bilaterally, normal work of breathing GI: soft, nontender, nondistended, + BS MS: no deformity or atrophy Skin: warm and dry  Neuro:  Strength and sensation are intact Psych: euthymic mood, full affect   Recent Labs: 07/23/2014: Magnesium 2.1 07/24/2014: BUN 9; Creatinine, Ser 1.00; Hemoglobin 13.5; Platelets 248; Potassium 4.2; Sodium 138    Lipid Panel  No results found for: CHOL, TRIG, HDL, CHOLHDL, VLDL, LDLCALC, LDLDIRECT   Wt Readings from Last 3 Encounters:  09/20/14 85.004 kg (187 lb 6.4 oz)  07/24/14 86.546 kg (190 lb 12.8 oz)  07/22/14 87 kg (191 lb 12.8 oz)      Other studies Reviewed: Review of the above records today demonstrates: recent echo reveals preserved EF with no valvular abnormality, recent myoview was normal, carotid dopplers revealed no obstructive CAS, event monitor reveals sinus with rare pvcs.   ASSESSMENT AND PLAN:  1.  Dizziness/ presyncope Unclear etiology  At this point, I think that an arrhythmogenic cause is unlikely Adequate hydration is encouraged No further CV workup planned  2. Chest discomfort- resolved Normal myoview  3. palpitations Continue diltiazem for now 30 day monitor reveals only rare PVCs, no SVT  Current medicines are reviewed at length with the patient today.   The patient does not have concerns regarding his medicines.  The following changes were made today:  none  Follow-up with primary care No further CV testing is planned at this time   Signed, Thompson Grayer, MD  09/20/2014 9:47 PM     [facility] [address] [place] [address] [phone] (office) [phone] (fax)

## 2014-09-27 ENCOUNTER — Telehealth: Payer: Self-pay | Admitting: Internal Medicine

## 2014-09-27 NOTE — Telephone Encounter (Signed)
New message    Request for surgical clearance:  1. What type of surgery is being performed? Epidural  2. When is this surgery scheduled? August 16  3. Are there any medications that need to be held prior to surgery and how long?Any blood thinners 5-7 day prior   4. Name of physician performing surgery? Dr Normajean Glasgow  5. What is your office phone and fax number? [facility] [phone] Fax [phone]  Fax was received back from our office and there is nothing noted whether cleared for surgery Please call to discuss

## 2014-09-27 NOTE — Telephone Encounter (Signed)
I will fax over the office note from Dr Rayann Heman  He is out for the next week. If further clearance needed he will be back in the office on 8/15

## 2014-12-09 ENCOUNTER — Other Ambulatory Visit: Payer: Self-pay | Admitting: Orthopedic Surgery

## 2014-12-20 ENCOUNTER — Encounter (HOSPITAL_COMMUNITY): Payer: Self-pay

## 2014-12-20 ENCOUNTER — Encounter (HOSPITAL_COMMUNITY)
Admission: RE | Admit: 2014-12-20 | Discharge: 2014-12-20 | Disposition: A | Discharge status: Home / Self Care | Payer: Commercial Managed Care - PPO | Source: Ambulatory Visit | Attending: Orthopedic Surgery | Admitting: Orthopedic Surgery

## 2014-12-20 DIAGNOSIS — Z7982 Long term (current) use of aspirin: Secondary | ICD-10-CM | POA: Diagnosis not present

## 2014-12-20 DIAGNOSIS — G8929 Other chronic pain: Secondary | ICD-10-CM | POA: Diagnosis not present

## 2014-12-20 DIAGNOSIS — I10 Essential (primary) hypertension: Secondary | ICD-10-CM | POA: Diagnosis not present

## 2014-12-20 DIAGNOSIS — Z87891 Personal history of nicotine dependence: Secondary | ICD-10-CM | POA: Diagnosis not present

## 2014-12-20 DIAGNOSIS — K219 Gastro-esophageal reflux disease without esophagitis: Secondary | ICD-10-CM | POA: Diagnosis not present

## 2014-12-20 DIAGNOSIS — G9529 Other cord compression: Secondary | ICD-10-CM | POA: Diagnosis not present

## 2014-12-20 DIAGNOSIS — M4802 Spinal stenosis, cervical region: Secondary | ICD-10-CM | POA: Diagnosis not present

## 2014-12-20 DIAGNOSIS — M25561 Pain in right knee: Secondary | ICD-10-CM | POA: Diagnosis not present

## 2014-12-20 DIAGNOSIS — I471 Supraventricular tachycardia: Secondary | ICD-10-CM | POA: Diagnosis not present

## 2014-12-20 DIAGNOSIS — E785 Hyperlipidemia, unspecified: Secondary | ICD-10-CM | POA: Diagnosis not present

## 2014-12-20 DIAGNOSIS — M5011 Cervical disc disorder with radiculopathy,  high cervical region: Secondary | ICD-10-CM | POA: Diagnosis present

## 2014-12-20 DIAGNOSIS — G4733 Obstructive sleep apnea (adult) (pediatric): Secondary | ICD-10-CM | POA: Diagnosis not present

## 2014-12-20 DIAGNOSIS — I2510 Atherosclerotic heart disease of native coronary artery without angina pectoris: Secondary | ICD-10-CM | POA: Diagnosis not present

## 2014-12-20 HISTORY — DX: Cardiac arrhythmia, unspecified: I49.9

## 2014-12-20 HISTORY — DX: Essential (primary) hypertension: I10

## 2014-12-20 LAB — URINALYSIS, ROUTINE W REFLEX MICROSCOPIC
Bilirubin Urine: NEGATIVE
Glucose, UA: NEGATIVE mg/dL
Hgb urine dipstick: NEGATIVE
Ketones, ur: NEGATIVE mg/dL
Leukocytes, UA: NEGATIVE
Nitrite: NEGATIVE
Protein, ur: NEGATIVE mg/dL
Specific Gravity, Urine: 1.009 (ref 1.005–1.030)
Urobilinogen, UA: 0.2 mg/dL (ref 0.0–1.0)
pH: 6 (ref 5.0–8.0)

## 2014-12-20 LAB — CBC WITH DIFFERENTIAL/PLATELET
Basophils Absolute: 0.1 10*3/uL (ref 0.0–0.1)
Basophils Relative: 1 %
Eosinophils Absolute: 0.1 10*3/uL (ref 0.0–0.7)
Eosinophils Relative: 1 %
HCT: 42.9 % (ref 39.0–52.0)
Hemoglobin: 14.5 g/dL (ref 13.0–17.0)
Lymphocytes Relative: 33 %
Lymphs Abs: 2.3 10*3/uL (ref 0.7–4.0)
MCH: 30.7 pg (ref 26.0–34.0)
MCHC: 33.8 g/dL (ref 30.0–36.0)
MCV: 90.7 fL (ref 78.0–100.0)
Monocytes Absolute: 0.8 10*3/uL (ref 0.1–1.0)
Monocytes Relative: 11 %
Neutro Abs: 3.8 10*3/uL (ref 1.7–7.7)
Neutrophils Relative %: 54 %
Platelets: 249 10*3/uL (ref 150–400)
RBC: 4.73 MIL/uL (ref 4.22–5.81)
RDW: 12.7 % (ref 11.5–15.5)
WBC: 7 10*3/uL (ref 4.0–10.5)

## 2014-12-20 LAB — COMPREHENSIVE METABOLIC PANEL
ALT: 18 U/L (ref 17–63)
AST: 22 U/L (ref 15–41)
Albumin: 4.1 g/dL (ref 3.5–5.0)
Alkaline Phosphatase: 43 U/L (ref 38–126)
Anion gap: 6 (ref 5–15)
BUN: 12 mg/dL (ref 6–20)
CO2: 30 mmol/L (ref 22–32)
Calcium: 9.7 mg/dL (ref 8.9–10.3)
Chloride: 104 mmol/L (ref 101–111)
Creatinine, Ser: 1.01 mg/dL (ref 0.61–1.24)
GFR calc Af Amer: >60 mL/min (ref >60)
GFR calc non Af Amer: >60 mL/min (ref >60)
Glucose, Bld: 115 mg/dL — ABNORMAL HIGH (ref 65–99)
Potassium: 4.2 mmol/L (ref 3.5–5.1)
Sodium: 140 mmol/L (ref 135–145)
Total Bilirubin: 0.8 mg/dL (ref 0.3–1.2)
Total Protein: 7.6 g/dL (ref 6.5–8.1)

## 2014-12-20 LAB — SURGICAL PCR SCREEN
MRSA, PCR: NEGATIVE
Staphylococcus aureus: NEGATIVE

## 2014-12-20 LAB — PROTIME-INR
INR: 1.1 (ref 0.00–1.49)
Prothrombin Time: 14.4 seconds (ref 11.6–15.2)

## 2014-12-20 LAB — APTT: aPTT: 27 seconds (ref 24–37)

## 2014-12-20 NOTE — Progress Notes (Signed)
PCp is Dr Maury Dus Cardiologist is Dr. Rayann Heman Echo noted in epic from 07-10-14 Stress test noted in epic from 07-24-14 Card cath noted from 2009 EKG noted form 07-24-14 CXR noted from 5--1-16

## 2014-12-20 NOTE — Pre-Procedure Instructions (Signed)
An Schnabel  12/20/2014      [facility] SERVICE - [place], [address] [place] [address] [place] [address] Phone: [phone] Fax: [phone]  [facility] [address] [place], [place] - [place] [address] Phone: [phone] Fax: [phone]    Your procedure is scheduled on Nov2  Report to [place] at 630 A.M.  Call this number if you have problems the morning of surgery:  [phone]   Remember:  Do not eat food or drink liquids after midnight.  Take these medicines the morning of surgery with A SIP OF WATER Tylenol if needed, diltiazem (Dilacor XR), esomeprazole (Nexium)  Stop taking aspirin, Ibuprofen, Aleve, BC's, Goody's, Herbal medications, Fish Oil, Vitamins   Do not wear jewelry, make-up or nail polish.  Do not wear lotions, powders, or perfumes.  You may wear deodorant.  Do not shave 48 hours prior to surgery.  Men may shave face and neck.  Do not bring valuables to the hospital.  [facility] is not responsible for any belongings or valuables.  Contacts, dentures or bridgework may not be worn into surgery.  Leave your suitcase in the car.  After surgery it may be brought to your room.  For patients admitted to the hospital, discharge time will be determined by your treatment team.  Patients discharged the day of surgery will not be allowed to drive home.    Special instructions:  [place] - Preparing for Surgery  Before surgery, you can play an important role.  Because skin is not sterile, your skin needs to be as free of germs as possible.  You can reduce the number of germs on you skin by washing with CHG (chlorahexidine gluconate) soap before surgery.  CHG is an antiseptic cleaner which kills germs and bonds with the skin to continue killing germs even after washing.  Please DO NOT use if you have an allergy to CHG or antibacterial  soaps.  If your skin becomes reddened/irritated stop using the CHG and inform your nurse when you arrive at Short Stay.  Do not shave (including legs and underarms) for at least 48 hours prior to the first CHG shower.  You may shave your face.  Please follow these instructions carefully:   1.  Shower with CHG Soap the night before surgery and the  morning of Surgery.  2.  If you choose to wash your hair, wash your hair first as usual with your normal shampoo.  3.  After you shampoo, rinse your hair and body thoroughly to remove the  Shampoo.  4.  Use CHG as you would any other liquid soap.  You can apply chg directly   to the skin and wash gently with scrungie or a clean washcloth.  5.  Apply the CHG Soap to your body ONLY FROM THE NECK DOWN.     Do not use on open wounds or open sores.  Avoid contact with your eyes,  ears, mouth and genitals (private parts).  Wash genitals (private parts)  with your normal soap.  6.  Wash thoroughly, paying special attention to the area where your surgery   will be performed.  7.  Thoroughly rinse your body with warm water from the neck down.  8.  DO NOT shower/wash with your normal soap after using and rinsing off  the CHG Soap.  9.  Pat yourself dry with a clean towel.  10.  Wear clean pajamas.            11.  Place clean sheets on your bed the night of your first shower and do not  sleep with pets.  Day of Surgery  Do not apply any lotions/deoderants the morning of surgery.  Please wear clean clothes to the hospital/surgery center.     Please read over the following fact sheets that you were given. Pain Booklet, Coughing and Deep Breathing, MRSA Information and Surgical Site Infection Prevention

## 2014-12-21 MED ORDER — CEFAZOLIN SODIUM-DEXTROSE 2-3 GM-% IV SOLR
2.0000 g | INTRAVENOUS | Status: AC
  Administered 2014-12-22: 2 g via INTRAVENOUS
  Filled 2014-12-21: qty 50

## 2014-12-21 MED ORDER — POVIDONE-IODINE 7.5 % EX SOLN
Freq: Once | CUTANEOUS | Status: DC
  Filled 2014-12-21: qty 118

## 2014-12-21 NOTE — H&P (Signed)
PREOPERATIVE H&P  Chief Complaint: Bilateral arm pain and numbness  HPI: David [name] is a 64 y.o. male who presents with ongoing pain in the bilateral arms x 6 months  MRI reveals varying degrees of stenosis C3-7  Patient has failed multiple forms of conservative care and continues to have pain (see office notes for additional details regarding the patient's full course of treatment)  Past Medical History  Diagnosis Date  . OSA (obstructive sleep apnea)     BIPAP  . Knee pain, right     PARTICULARY AT NIGHT  . Back pain, chronic   . GERD (gastroesophageal reflux disease)   . Hyperlipidemia     INTOLERANT OF STATINS  . Palpitations   . History of PSVT (paroxysmal supraventricular tachycardia) 2009    nonsustained atach per Dr Marlou Porch on monitor in 2009. treated with diltiazem  . Chronic low back pain   . Chronic knee pain   . CAD (coronary artery disease) 2009    nonobstructive by cath  . Near syncope 07/23/2014  . Hypertension   . Dysrhythmia    Past Surgical History  Procedure Laterality Date  . Knee arthroscopy Right 1982  . Vasectomy  1980  . Ercp  2006  . Laparoscopic cholecystectomy  2006  . Colonoscopy      2003, 2008, 2013  . Coronary angioplasty  07/2007    NONOBSTRUCTIVE CORONARY PLAQUING- MINOR 20 % OSTIAL DIAGONAL STENOSIS, NORMAL LEFT VENTRICULAR EF 55%  . Cholecystectomy     Social History   Social History  . Marital Status: Married    Spouse Name: N/A  . Number of Children: 2  . Years of Education: N/A   Occupational History  . ACCOUNTANT    Social History Main Topics  . Smoking status: Former Smoker -- 2.50 packs/day    Types: Cigarettes    Quit date: 02/20/2000  . Smokeless tobacco: Not on file  . Alcohol Use: Yes     Comment: 3 beers a day  . Drug Use: No  . Sexual Activity: Not on file   Other Topics Concern  . Not on file   Social History Narrative   Lives in [place] with spouse.  2 daughters.      Family History    Problem Relation Age of Onset  . Heart attack Mother 50  . CVA Mother 40    AND AGAIN AT 6  . Colon cancer Father   . Pneumonia Father     WITH SEPSIS  . Heart attack Father   . Diabetes Maternal Grandmother   . Hyperlipidemia Sister   . Hypertension Sister   . Stroke Brother 67  . Hypertension Brother   . Dysrhythmia Brother     and DIZZINESS  . GER disease Sister    No Known Allergies Prior to Admission medications   Medication Sig Start Date End Date Taking? Authorizing Provider  acetaminophen (TYLENOL) 500 MG tablet Take 500 mg by mouth daily.   Yes Historical Provider, MD  diltiazem (DILACOR XR) 180 MG 24 hr capsule Take 180 mg by mouth 2 (two) times daily.    Yes Historical Provider, MD  esomeprazole (NEXIUM) 40 MG capsule Take 40 mg by mouth daily.    Yes Historical Provider, MD  Multiple Vitamins-Minerals (MULTIVITAMIN WITH MINERALS) tablet Take 1 tablet by mouth daily.   Yes Historical Provider, MD  rosuvastatin (CRESTOR) 10 MG tablet Take 10 mg by mouth at bedtime.    Yes Historical Provider, MD  aspirin EC 81 MG tablet Take 81 mg by mouth daily.    Historical Provider, MD  ibuprofen (ADVIL,MOTRIN) 200 MG tablet Take 400 mg by mouth 3 (three) times daily.    Historical Provider, MD     All other systems have been reviewed and were otherwise negative with the exception of those mentioned in the HPI and as above.  Physical Exam: There were no vitals filed for this visit.  General: Alert, no acute distress Cardiovascular: No pedal edema Respiratory: No cyanosis, no use of accessory musculature Skin: No lesions in the area of chief complaint Neurologic: Sensation intact distally Psychiatric: Patient is competent for consent with normal mood and affect Lymphatic: No axillary or cervical lymphadenopathy   Assessment/Plan: Radiculopathy Plan for Procedure(s): ANTERIOR CERVICAL DECOMPRESSION/DISCECTOMY FUSION 4 LEVELS   Sinclair Ship, MD 12/21/2014 9:33  AM

## 2014-12-21 NOTE — Progress Notes (Signed)
Anesthesia Chart Review:  Pt is 64 year old male scheduled for C3-4, C4-5, C5-6, C6-7 ACDF on 12/22/2014 with Dr. Lynann Bologna.   PCP is Dr. Maury Dus. EP cardiologist Dr. Rayann Heman, last office visit 09/20/14. Marland Kitchen   PMH includes: CAD (nonobstructive by 2009 cath), PSVT (2009), HTN, OSA (BiPAP), hyperlipidemia, GERD.  Former smoker. BMI 27.   Pt hospitalized 6/3-07/24/14 for near syncope, chest discomfort. Stress test and cardiac enzymes x3 negative. Prior echo normal. F/u outpatient carotid US and cardiac event monitor unremarkable. CV cause of sx ruled out.   Medications include: ASA, diltiazem, nexium, crestor.   Preoperative labs reviewed.    Chest x-ray 06/30/14 reviewed. No active cardiopulmonary disease.   EKG 07/24/14: sinus bradycardia (55 bpm).   Cardiac event monitor 07/30/14: sinus rhythm with occasional PVCs. No pauses, significant brady arrhythmias, SVT or VT.   Carotid duplex US 07/30/14: 1-39% bilateral ICA stenosis.  Nuclear stress test 07/24/14:   The study is normal.  This is a low risk study.  The left ventricular ejection fraction is normal (55-65%).  Normal Wall Motion. No ischemia.  Echo 07/10/14:  -Normal study, normal cardiac chamber sizes and function, normal valve anatomy and function, no pericardial effusion or intracardiac mass. No intracardiac shunts. Normal thoracic aorta and aortic arch.   Cardiac cath 08/08/07:  1. No angiographically significant coronary artery disease, very mild 20% stenosis of the small first diagonal branch. 2. Normal left ventricular ejection fraction estimated at 55% with no wall motion abnormalities. 3. Elevated left ventricular end-diastolic pressure of 80 mmHg consistent with diastolic dysfunction.  If no changes, I anticipate pt can proceed with surgery as scheduled.   Willeen Cass, FNP-BC [facility] Stay [facility]/Anesthesiology Phone: [phone] 12/21/2014 9:22 AM

## 2014-12-22 ENCOUNTER — Encounter (HOSPITAL_COMMUNITY): Payer: Self-pay | Admitting: *Deleted

## 2014-12-22 ENCOUNTER — Ambulatory Visit (HOSPITAL_COMMUNITY): Payer: Commercial Managed Care - PPO | Admitting: Certified Registered"

## 2014-12-22 ENCOUNTER — Ambulatory Visit (HOSPITAL_COMMUNITY): Payer: Commercial Managed Care - PPO | Admitting: Emergency Medicine

## 2014-12-22 ENCOUNTER — Encounter (HOSPITAL_COMMUNITY)
Admission: RE | Disposition: A | Discharge status: Home / Self Care | Payer: Self-pay | Source: Ambulatory Visit | Attending: Orthopedic Surgery

## 2014-12-22 ENCOUNTER — Ambulatory Visit (HOSPITAL_COMMUNITY): Payer: Commercial Managed Care - PPO

## 2014-12-22 ENCOUNTER — Observation Stay (HOSPITAL_COMMUNITY)
Admission: RE | Admit: 2014-12-22 | Discharge: 2014-12-23 | Disposition: A | Discharge status: Home / Self Care | Payer: Commercial Managed Care - PPO | Source: Ambulatory Visit | Attending: Orthopedic Surgery | Admitting: Orthopedic Surgery

## 2014-12-22 DIAGNOSIS — Z7982 Long term (current) use of aspirin: Secondary | ICD-10-CM | POA: Insufficient documentation

## 2014-12-22 DIAGNOSIS — G4733 Obstructive sleep apnea (adult) (pediatric): Secondary | ICD-10-CM | POA: Insufficient documentation

## 2014-12-22 DIAGNOSIS — M4802 Spinal stenosis, cervical region: Secondary | ICD-10-CM | POA: Insufficient documentation

## 2014-12-22 DIAGNOSIS — M25561 Pain in right knee: Secondary | ICD-10-CM | POA: Insufficient documentation

## 2014-12-22 DIAGNOSIS — M5410 Radiculopathy, site unspecified: Secondary | ICD-10-CM | POA: Diagnosis present

## 2014-12-22 DIAGNOSIS — Z419 Encounter for procedure for purposes other than remedying health state, unspecified: Secondary | ICD-10-CM

## 2014-12-22 DIAGNOSIS — K219 Gastro-esophageal reflux disease without esophagitis: Secondary | ICD-10-CM | POA: Insufficient documentation

## 2014-12-22 DIAGNOSIS — I2510 Atherosclerotic heart disease of native coronary artery without angina pectoris: Secondary | ICD-10-CM | POA: Insufficient documentation

## 2014-12-22 DIAGNOSIS — G8929 Other chronic pain: Secondary | ICD-10-CM | POA: Insufficient documentation

## 2014-12-22 DIAGNOSIS — M5011 Cervical disc disorder with radiculopathy,  high cervical region: Principal | ICD-10-CM | POA: Insufficient documentation

## 2014-12-22 DIAGNOSIS — I10 Essential (primary) hypertension: Secondary | ICD-10-CM | POA: Insufficient documentation

## 2014-12-22 DIAGNOSIS — Z87891 Personal history of nicotine dependence: Secondary | ICD-10-CM | POA: Insufficient documentation

## 2014-12-22 DIAGNOSIS — G9529 Other cord compression: Secondary | ICD-10-CM | POA: Insufficient documentation

## 2014-12-22 DIAGNOSIS — E785 Hyperlipidemia, unspecified: Secondary | ICD-10-CM | POA: Insufficient documentation

## 2014-12-22 DIAGNOSIS — I471 Supraventricular tachycardia: Secondary | ICD-10-CM | POA: Insufficient documentation

## 2014-12-22 HISTORY — PX: ANTERIOR CERVICAL DECOMPRESSION/DISCECTOMY FUSION 4 LEVELS: SHX5556

## 2014-12-22 SURGERY — ANTERIOR CERVICAL DECOMPRESSION/DISCECTOMY FUSION 4 LEVELS
Anesthesia: General

## 2014-12-22 MED ORDER — MIDAZOLAM HCL 5 MG/5ML IJ SOLN
INTRAMUSCULAR | Status: DC | PRN
  Administered 2014-12-22: 2 mg via INTRAVENOUS

## 2014-12-22 MED ORDER — DILTIAZEM HCL ER 180 MG PO CP24
180.0000 mg | ORAL_CAPSULE | Freq: Two times a day (BID) | ORAL | Status: DC
  Administered 2014-12-22 – 2014-12-23 (×2): 180 mg via ORAL
  Filled 2014-12-22 (×3): qty 1

## 2014-12-22 MED ORDER — ACETAMINOPHEN 650 MG RE SUPP
650.0000 mg | RECTAL | Status: DC | PRN
Start: 2014-12-22 — End: 2014-12-23

## 2014-12-22 MED ORDER — 0.9 % SODIUM CHLORIDE (POUR BTL) OPTIME
TOPICAL | Status: DC | PRN
  Administered 2014-12-22: 1000 mL

## 2014-12-22 MED ORDER — LACTATED RINGERS IV SOLN
INTRAVENOUS | Status: DC | PRN
  Administered 2014-12-22 (×2): via INTRAVENOUS

## 2014-12-22 MED ORDER — PANTOPRAZOLE SODIUM 40 MG PO TBEC
80.0000 mg | DELAYED_RELEASE_TABLET | Freq: Every day | ORAL | Status: DC
  Administered 2014-12-23: 80 mg via ORAL
  Filled 2014-12-22: qty 2

## 2014-12-22 MED ORDER — ONDANSETRON HCL 4 MG/2ML IJ SOLN: 4.0000 mg | INTRAMUSCULAR | Status: DC | PRN

## 2014-12-22 MED ORDER — ROCURONIUM BROMIDE 100 MG/10ML IV SOLN
INTRAVENOUS | Status: DC | PRN
  Administered 2014-12-22: 30 mg via INTRAVENOUS

## 2014-12-22 MED ORDER — EPHEDRINE SULFATE 50 MG/ML IJ SOLN
INTRAMUSCULAR | Status: AC
  Filled 2014-12-22: qty 1

## 2014-12-22 MED ORDER — FENTANYL CITRATE (PF) 250 MCG/5ML IJ SOLN
INTRAMUSCULAR | Status: AC
  Filled 2014-12-22: qty 5

## 2014-12-22 MED ORDER — ONDANSETRON HCL 4 MG/2ML IJ SOLN
INTRAMUSCULAR | Status: AC
  Filled 2014-12-22: qty 2

## 2014-12-22 MED ORDER — PROPOFOL 10 MG/ML IV BOLUS
INTRAVENOUS | Status: AC
  Filled 2014-12-22: qty 20

## 2014-12-22 MED ORDER — SUCCINYLCHOLINE CHLORIDE 20 MG/ML IJ SOLN
INTRAMUSCULAR | Status: DC | PRN
  Administered 2014-12-22: 120 mg via INTRAVENOUS

## 2014-12-22 MED ORDER — ARTIFICIAL TEARS OP OINT
TOPICAL_OINTMENT | OPHTHALMIC | Status: AC
  Filled 2014-12-22: qty 3.5

## 2014-12-22 MED ORDER — CEFAZOLIN SODIUM 1-5 GM-% IV SOLN
1.0000 g | Freq: Three times a day (TID) | INTRAVENOUS | Status: AC
  Administered 2014-12-22 – 2014-12-23 (×2): 1 g via INTRAVENOUS
  Filled 2014-12-22 (×2): qty 50

## 2014-12-22 MED ORDER — SUCCINYLCHOLINE CHLORIDE 20 MG/ML IJ SOLN
INTRAMUSCULAR | Status: AC
  Filled 2014-12-22: qty 1

## 2014-12-22 MED ORDER — ZOLPIDEM TARTRATE 5 MG PO TABS: 5.0000 mg | ORAL_TABLET | Freq: Every evening | ORAL | Status: DC | PRN

## 2014-12-22 MED ORDER — ROCURONIUM BROMIDE 50 MG/5ML IV SOLN
INTRAVENOUS | Status: AC
  Filled 2014-12-22: qty 1

## 2014-12-22 MED ORDER — CEFAZOLIN SODIUM-DEXTROSE 2-3 GM-% IV SOLR
INTRAVENOUS | Status: AC
  Filled 2014-12-22: qty 50

## 2014-12-22 MED ORDER — BISACODYL 5 MG PO TBEC: 5.0000 mg | DELAYED_RELEASE_TABLET | Freq: Every day | ORAL | Status: DC | PRN

## 2014-12-22 MED ORDER — SODIUM CHLORIDE 0.9 % IJ SOLN: 3.0000 mL | INTRAMUSCULAR | Status: DC | PRN

## 2014-12-22 MED ORDER — PHENYLEPHRINE HCL 10 MG/ML IJ SOLN
10.0000 mg | INTRAVENOUS | Status: DC | PRN
  Administered 2014-12-22: 20 ug/min via INTRAVENOUS

## 2014-12-22 MED ORDER — ROSUVASTATIN CALCIUM 20 MG PO TABS
10.0000 mg | ORAL_TABLET | Freq: Every day | ORAL | Status: DC
  Administered 2014-12-22: 10 mg via ORAL
  Filled 2014-12-22: qty 1

## 2014-12-22 MED ORDER — CEFAZOLIN SODIUM-DEXTROSE 2-3 GM-% IV SOLR
2.0000 g | Freq: Once | INTRAVENOUS | Status: AC
  Administered 2014-12-22: 2 g via INTRAVENOUS
  Filled 2014-12-22: qty 50

## 2014-12-22 MED ORDER — LIDOCAINE HCL (CARDIAC) 20 MG/ML IV SOLN
INTRAVENOUS | Status: DC | PRN
  Administered 2014-12-22: 80 mg via INTRAVENOUS

## 2014-12-22 MED ORDER — OXYCODONE-ACETAMINOPHEN 5-325 MG PO TABS
1.0000 | ORAL_TABLET | ORAL | Status: DC | PRN
  Administered 2014-12-22 – 2014-12-23 (×4): 2 via ORAL
  Filled 2014-12-22 (×4): qty 2

## 2014-12-22 MED ORDER — SODIUM CHLORIDE 0.9 % IV SOLN: 250.0000 mL | INTRAVENOUS | Status: DC

## 2014-12-22 MED ORDER — FENTANYL CITRATE (PF) 250 MCG/5ML IJ SOLN
INTRAMUSCULAR | Status: DC | PRN
  Administered 2014-12-22 (×2): 50 ug via INTRAVENOUS
  Administered 2014-12-22: 100 ug via INTRAVENOUS
  Administered 2014-12-22 (×3): 50 ug via INTRAVENOUS

## 2014-12-22 MED ORDER — HYDROCODONE-ACETAMINOPHEN 5-325 MG PO TABS: 1.0000 | ORAL_TABLET | ORAL | Status: DC | PRN

## 2014-12-22 MED ORDER — ACETAMINOPHEN 325 MG PO TABS: 650.0000 mg | ORAL_TABLET | ORAL | Status: DC | PRN

## 2014-12-22 MED ORDER — SODIUM CHLORIDE 0.9 % IJ SOLN: 3.0000 mL | Freq: Two times a day (BID) | INTRAMUSCULAR | Status: DC

## 2014-12-22 MED ORDER — METOCLOPRAMIDE HCL 5 MG/ML IJ SOLN
INTRAMUSCULAR | Status: AC
  Administered 2014-12-22: 5 mg
  Filled 2014-12-22: qty 2

## 2014-12-22 MED ORDER — BUPIVACAINE-EPINEPHRINE 0.25% -1:200000 IJ SOLN
INTRAMUSCULAR | Status: DC | PRN
  Administered 2014-12-22: 10 mL

## 2014-12-22 MED ORDER — HYDROMORPHONE HCL 1 MG/ML IJ SOLN
0.2500 mg | INTRAMUSCULAR | Status: DC | PRN
  Administered 2014-12-22 (×2): 0.5 mg via INTRAVENOUS

## 2014-12-22 MED ORDER — NEOSTIGMINE METHYLSULFATE 10 MG/10ML IV SOLN
INTRAVENOUS | Status: DC | PRN
  Administered 2014-12-22: 3 mg via INTRAVENOUS

## 2014-12-22 MED ORDER — DOCUSATE SODIUM 100 MG PO CAPS
100.0000 mg | ORAL_CAPSULE | Freq: Two times a day (BID) | ORAL | Status: DC
  Administered 2014-12-22: 100 mg via ORAL
  Filled 2014-12-22: qty 1

## 2014-12-22 MED ORDER — THROMBIN 20000 UNITS EX SOLR
CUTANEOUS | Status: AC
  Filled 2014-12-22: qty 20000

## 2014-12-22 MED ORDER — LACTATED RINGERS IV SOLN
INTRAVENOUS | Status: DC
  Administered 2014-12-22: 15:00:00 via INTRAVENOUS

## 2014-12-22 MED ORDER — BUPIVACAINE-EPINEPHRINE (PF) 0.25% -1:200000 IJ SOLN
INTRAMUSCULAR | Status: AC
  Filled 2014-12-22: qty 30

## 2014-12-22 MED ORDER — METOCLOPRAMIDE HCL 5 MG/ML IJ SOLN
5.0000 mg | INTRAMUSCULAR | Status: AC
  Administered 2014-12-22 (×2): 5 mg via INTRAVENOUS

## 2014-12-22 MED ORDER — GLYCOPYRROLATE 0.2 MG/ML IJ SOLN
INTRAMUSCULAR | Status: DC | PRN
  Administered 2014-12-22: 0.4 mg via INTRAVENOUS

## 2014-12-22 MED ORDER — THROMBIN 20000 UNITS EX SOLR
CUTANEOUS | Status: DC | PRN
  Administered 2014-12-22: 20000 mL via TOPICAL

## 2014-12-22 MED ORDER — PROMETHAZINE HCL 25 MG/ML IJ SOLN
INTRAMUSCULAR | Status: AC
  Filled 2014-12-22: qty 1

## 2014-12-22 MED ORDER — DIAZEPAM 5 MG PO TABS
5.0000 mg | ORAL_TABLET | Freq: Four times a day (QID) | ORAL | Status: DC | PRN
  Administered 2014-12-23: 5 mg via ORAL
  Filled 2014-12-22: qty 1

## 2014-12-22 MED ORDER — METOCLOPRAMIDE HCL 5 MG/5ML PO SOLN: 5.0000 mg | ORAL | Status: DC

## 2014-12-22 MED ORDER — LIDOCAINE HCL (CARDIAC) 20 MG/ML IV SOLN
INTRAVENOUS | Status: AC
  Filled 2014-12-22: qty 5

## 2014-12-22 MED ORDER — DEXAMETHASONE SODIUM PHOSPHATE 10 MG/ML IJ SOLN
INTRAMUSCULAR | Status: AC
  Filled 2014-12-22: qty 1

## 2014-12-22 MED ORDER — PHENYLEPHRINE HCL 10 MG/ML IJ SOLN
INTRAMUSCULAR | Status: DC | PRN
  Administered 2014-12-22: 120 ug via INTRAVENOUS
  Administered 2014-12-22: 80 ug via INTRAVENOUS

## 2014-12-22 MED ORDER — MIDAZOLAM HCL 2 MG/2ML IJ SOLN
INTRAMUSCULAR | Status: AC
  Filled 2014-12-22: qty 4

## 2014-12-22 MED ORDER — PHENOL 1.4 % MT LIQD: 1.0000 | OROMUCOSAL | Status: DC | PRN

## 2014-12-22 MED ORDER — PROMETHAZINE HCL 25 MG/ML IJ SOLN
6.2500 mg | INTRAMUSCULAR | Status: DC | PRN
  Administered 2014-12-22: 6.25 mg via INTRAVENOUS

## 2014-12-22 MED ORDER — GLYCOPYRROLATE 0.2 MG/ML IJ SOLN
INTRAMUSCULAR | Status: AC
  Filled 2014-12-22: qty 2

## 2014-12-22 MED ORDER — PROPOFOL 10 MG/ML IV BOLUS
INTRAVENOUS | Status: DC | PRN
  Administered 2014-12-22: 160 mg via INTRAVENOUS

## 2014-12-22 MED ORDER — MORPHINE SULFATE (PF) 2 MG/ML IV SOLN
1.0000 mg | INTRAVENOUS | Status: DC | PRN
  Administered 2014-12-22: 2 mg via INTRAVENOUS
  Filled 2014-12-22: qty 2

## 2014-12-22 MED ORDER — ALUM & MAG HYDROXIDE-SIMETH 200-200-20 MG/5ML PO SUSP: 30.0000 mL | Freq: Four times a day (QID) | ORAL | Status: DC | PRN

## 2014-12-22 MED ORDER — PROMETHAZINE HCL 25 MG/ML IJ SOLN
6.2500 mg | INTRAMUSCULAR | Status: AC | PRN
  Administered 2014-12-22: 6.25 mg via INTRAVENOUS
  Administered 2014-12-22: 12.5 mg via INTRAVENOUS

## 2014-12-22 MED ORDER — HYDROMORPHONE HCL 1 MG/ML IJ SOLN
INTRAMUSCULAR | Status: AC
  Filled 2014-12-22: qty 1

## 2014-12-22 MED ORDER — SENNOSIDES-DOCUSATE SODIUM 8.6-50 MG PO TABS: 1.0000 | ORAL_TABLET | Freq: Every evening | ORAL | Status: DC | PRN

## 2014-12-22 MED ORDER — ONDANSETRON HCL 4 MG/2ML IJ SOLN
INTRAMUSCULAR | Status: DC | PRN
  Administered 2014-12-22: 4 mg via INTRAVENOUS

## 2014-12-22 MED ORDER — MENTHOL 3 MG MT LOZG: 1.0000 | LOZENGE | OROMUCOSAL | Status: DC | PRN

## 2014-12-22 MED ORDER — PHENYLEPHRINE 40 MCG/ML (10ML) SYRINGE FOR IV PUSH (FOR BLOOD PRESSURE SUPPORT)
PREFILLED_SYRINGE | INTRAVENOUS | Status: AC
  Filled 2014-12-22: qty 10

## 2014-12-22 MED ORDER — ARTIFICIAL TEARS OP OINT
TOPICAL_OINTMENT | OPHTHALMIC | Status: DC | PRN
  Administered 2014-12-22: 1 via OPHTHALMIC

## 2014-12-22 SURGICAL SUPPLY — 80 items
APL SKNCLS STERI-STRIP NONHPOA (GAUZE/BANDAGES/DRESSINGS) ×1
BENZOIN TINCTURE PRP APPL 2/3 (GAUZE/BANDAGES/DRESSINGS) ×2 IMPLANT
BIT DRILL NEURO 2X3.1 SFT TUCH (MISCELLANEOUS) ×1 IMPLANT
BIT DRILL SRG 14X2.2XFLT CHK (BIT) IMPLANT
BIT DRL SRG 14X2.2XFLT CHK (BIT) ×1
BLADE SURG 15 STRL LF DISP TIS (BLADE) ×1 IMPLANT
BLADE SURG 15 STRL SS (BLADE) ×2
BLADE SURG ROTATE 9660 (MISCELLANEOUS) ×2 IMPLANT
BUR MATCHSTICK NEURO 3.0 LAGG (BURR) ×1 IMPLANT
CARTRIDGE OIL MAESTRO DRILL (MISCELLANEOUS) ×1 IMPLANT
CLSR STERI-STRIP ANTIMIC 1/2X4 (GAUZE/BANDAGES/DRESSINGS) ×1 IMPLANT
CORDS BIPOLAR (ELECTRODE) ×2 IMPLANT
COVER SURGICAL LIGHT HANDLE (MISCELLANEOUS) ×2 IMPLANT
CRADLE DONUT ADULT HEAD (MISCELLANEOUS) ×2 IMPLANT
DIFFUSER DRILL AIR PNEUMATIC (MISCELLANEOUS) ×2 IMPLANT
DRAIN JACKSON RD 7FR 3/32 (WOUND CARE) IMPLANT
DRAPE C-ARM 42X72 X-RAY (DRAPES) ×2 IMPLANT
DRAPE POUCH INSTRU U-SHP 10X18 (DRAPES) ×2 IMPLANT
DRAPE SURG 17X23 STRL (DRAPES) ×6 IMPLANT
DRILL BIT SKYLINE 14MM (BIT) ×2
DRILL NEURO 2X3.1 SOFT TOUCH (MISCELLANEOUS) ×2
DURAPREP 26ML APPLICATOR (WOUND CARE) ×2 IMPLANT
ELECT COATED BLADE 2.86 ST (ELECTRODE) ×2 IMPLANT
ELECT REM PT RETURN 9FT ADLT (ELECTROSURGICAL) ×2
ELECTRODE REM PT RTRN 9FT ADLT (ELECTROSURGICAL) ×1 IMPLANT
EVACUATOR SILICONE 100CC (DRAIN) IMPLANT
GAUZE SPONGE 4X4 12PLY STRL (GAUZE/BANDAGES/DRESSINGS) ×2 IMPLANT
GAUZE SPONGE 4X4 16PLY XRAY LF (GAUZE/BANDAGES/DRESSINGS) ×2 IMPLANT
GLOVE BIO SURGEON STRL SZ7 (GLOVE) ×2 IMPLANT
GLOVE BIO SURGEON STRL SZ8 (GLOVE) ×2 IMPLANT
GLOVE BIOGEL PI IND STRL 7.0 (GLOVE) ×2 IMPLANT
GLOVE BIOGEL PI IND STRL 8 (GLOVE) ×1 IMPLANT
GLOVE BIOGEL PI INDICATOR 7.0 (GLOVE) ×2
GLOVE BIOGEL PI INDICATOR 8 (GLOVE) ×1
GOWN STRL REUS W/ TWL LRG LVL3 (GOWN DISPOSABLE) ×1 IMPLANT
GOWN STRL REUS W/ TWL XL LVL3 (GOWN DISPOSABLE) ×1 IMPLANT
GOWN STRL REUS W/TWL LRG LVL3 (GOWN DISPOSABLE) ×2
GOWN STRL REUS W/TWL XL LVL3 (GOWN DISPOSABLE) ×2
IMPL S ENDOSKEL TC 7 ODEG (Orthopedic Implant) IMPLANT
IMPL S ENDOSKEL TC 8MM ODEG (Orthopedic Implant) IMPLANT
IMPLANT S ENDOSKEL TC 7 ODEG (Orthopedic Implant) ×4 IMPLANT
IMPLANT S ENDOSKEL TC 8MM ODEG (Orthopedic Implant) ×2 IMPLANT
INTERLOCK LRDTC CRVCL VBR 8MM (Peek) IMPLANT
IV CATH 14GX2 1/4 (CATHETERS) ×2 IMPLANT
KIT BASIN OR (CUSTOM PROCEDURE TRAY) ×2 IMPLANT
KIT ROOM TURNOVER OR (KITS) ×2 IMPLANT
LORDOTIC CERVICAL VBR 8MM SM (Peek) ×2 IMPLANT
MANIFOLD NEPTUNE II (INSTRUMENTS) ×1 IMPLANT
NDL SPNL 20GX3.5 QUINCKE YW (NEEDLE) ×1 IMPLANT
NEEDLE 27GAX1X1/2 (NEEDLE) ×2 IMPLANT
NEEDLE SPNL 20GX3.5 QUINCKE YW (NEEDLE) ×2 IMPLANT
NS IRRIG 1000ML POUR BTL (IV SOLUTION) ×3 IMPLANT
OIL CARTRIDGE MAESTRO DRILL (MISCELLANEOUS) ×2
PACK ORTHO CERVICAL (CUSTOM PROCEDURE TRAY) ×2 IMPLANT
PAD ARMBOARD 7.5X6 YLW CONV (MISCELLANEOUS) ×4 IMPLANT
PATTIES SURGICAL .5 X.5 (GAUZE/BANDAGES/DRESSINGS) IMPLANT
PATTIES SURGICAL .5 X1 (DISPOSABLE) IMPLANT
PIN DISTRACTION 14 (PIN) ×2 IMPLANT
PLATE SKYLINE 4LVL 64M CERV (Plate) ×1 IMPLANT
PUTTY BONE DBX 5CC MIX (Putty) ×1 IMPLANT
SCREW SKYLINE VAR OS 14MM (Screw) ×3 IMPLANT
SCREW SKYLINE VARIABLE LG (Screw) ×1 IMPLANT
SCREW VAR SELF TAP SKYLINE 14M (Screw) ×6 IMPLANT
SPONGE GAUZE 4X4 12PLY STER LF (GAUZE/BANDAGES/DRESSINGS) ×1 IMPLANT
SPONGE INTESTINAL PEANUT (DISPOSABLE) ×6 IMPLANT
SPONGE SURGIFOAM ABS GEL 100 (HEMOSTASIS) ×2 IMPLANT
STRIP CLOSURE SKIN 1/2X4 (GAUZE/BANDAGES/DRESSINGS) ×2 IMPLANT
SURGIFLO W/THROMBIN 8M KIT (HEMOSTASIS) IMPLANT
SUT MNCRL AB 4-0 PS2 18 (SUTURE) IMPLANT
SUT VIC AB 2-0 CT2 18 VCP726D (SUTURE) ×2 IMPLANT
SYR BULB IRRIGATION 50ML (SYRINGE) ×2 IMPLANT
SYR CONTROL 10ML LL (SYRINGE) ×4 IMPLANT
TAPE CLOTH 4X10 WHT NS (GAUZE/BANDAGES/DRESSINGS) ×2 IMPLANT
TAPE CLOTH SURG 4X10 WHT LF (GAUZE/BANDAGES/DRESSINGS) ×1 IMPLANT
TAPE UMBILICAL COTTON 1/8X30 (MISCELLANEOUS) ×2 IMPLANT
TOWEL OR 17X24 6PK STRL BLUE (TOWEL DISPOSABLE) ×2 IMPLANT
TOWEL OR 17X26 10 PK STRL BLUE (TOWEL DISPOSABLE) ×2 IMPLANT
TRAY FOLEY CATH 16FRSI W/METER (SET/KITS/TRAYS/PACK) ×2 IMPLANT
WATER STERILE IRR 1000ML POUR (IV SOLUTION) ×2 IMPLANT
YANKAUER SUCT BULB TIP NO VENT (SUCTIONS) ×2 IMPLANT

## 2014-12-22 NOTE — Transfer of Care (Signed)
Immediate Anesthesia Transfer of Care Note  Patient: David [name]  Procedure(s) Performed: Procedure(s) with comments: ANTERIOR CERVICAL DECOMPRESSION/DISCECTOMY FUSION 4 LEVELS (N/A) - Anteriro cervical decompression fusion, cervical 3-4, cervical 4-5, cervical 5-6, cervical 6-7 with instrumentation and allograft  Patient Location: PACU  Anesthesia Type:General  Level of Consciousness: awake, alert  and oriented  Airway & Oxygen Therapy: Patient Spontanous Breathing and Patient connected to nasal cannula oxygen  Post-op Assessment: Report given to RN, Post -op Vital signs reviewed and stable and Patient moving all extremities X 4  Post vital signs: Reviewed and stable  Last Vitals:  Filed Vitals:   12/22/14 1341  BP:   Pulse:   Temp: 36.9 C  Resp:   HR 66, RR 16, Sats 94% on 2L, BP 415/83  Complications: No apparent anesthesia complications

## 2014-12-22 NOTE — Anesthesia Preprocedure Evaluation (Addendum)
Anesthesia Evaluation  Patient identified by MRN, date of birth, ID band Patient awake    Reviewed: Allergy & Precautions, NPO status , Patient's Chart, lab work & pertinent test results  Airway Mallampati: II  TM Distance: >3 FB Neck ROM: Full    Dental  (+) Dental Advisory Given   Pulmonary shortness of breath, sleep apnea and Continuous Positive Airway Pressure Ventilation , former smoker,    breath sounds clear to auscultation       Cardiovascular hypertension, + CAD  + dysrhythmias Supra Ventricular Tachycardia  Rhythm:Regular Rate:Normal     Neuro/Psych    GI/Hepatic Neg liver ROS, GERD  Medicated,  Endo/Other    Renal/GU negative Renal ROS     Musculoskeletal   Abdominal   Peds  Hematology   Anesthesia Other Findings   Reproductive/Obstetrics                            Anesthesia Physical Anesthesia Plan  ASA: III  Anesthesia Plan: General   Post-op Pain Management:    Induction: Intravenous  Airway Management Planned: Oral ETT  Additional Equipment:   Intra-op Plan:   Post-operative Plan: Extubation in OR  Informed Consent: I have reviewed the patients History and Physical, chart, labs and discussed the procedure including the risks, benefits and alternatives for the proposed anesthesia with the patient or authorized representative who has indicated his/her understanding and acceptance.   Dental advisory given  Plan Discussed with: Anesthesiologist, Surgeon and CRNA  Anesthesia Plan Comments:        Anesthesia Quick Evaluation

## 2014-12-22 NOTE — Anesthesia Procedure Notes (Signed)
Procedure Name: Intubation Date/Time: 12/22/2014 8:36 AM Performed by: Merrilyn Puma B Pre-anesthesia Checklist: Patient identified, Emergency Drugs available, Suction available, Patient being monitored and Timeout performed Patient Re-evaluated:Patient Re-evaluated prior to inductionOxygen Delivery Method: Circle system utilized Preoxygenation: Pre-oxygenation with 100% oxygen Intubation Type: IV induction and Cricoid Pressure applied Ventilation: Mask ventilation without difficulty Laryngoscope Size: Mac and 4 Grade View: Grade II Tube type: Oral Tube size: 7.5 mm Number of attempts: 1 Airway Equipment and Method: Stylet Placement Confirmation: breath sounds checked- equal and bilateral,  CO2 detector,  positive ETCO2 and ETT inserted through vocal cords under direct vision Secured at: 23 cm Tube secured with: Tape Dental Injury: Teeth and Oropharynx as per pre-operative assessment

## 2014-12-23 ENCOUNTER — Encounter (HOSPITAL_COMMUNITY): Payer: Self-pay | Admitting: Orthopedic Surgery

## 2014-12-23 DIAGNOSIS — M5011 Cervical disc disorder with radiculopathy,  high cervical region: Secondary | ICD-10-CM | POA: Diagnosis not present

## 2014-12-23 NOTE — Progress Notes (Signed)
Pt doing well. Pt and family given D/C instructions with Rx's, verbal understanding was provided. Pt's incision is clean and dry with no sign of infection. Pt's IV was removed prior to D/C. Pt has Aspen collar in place and Philly collar for shower use. Pt D/C'd home via wheelchair @ 1125 per MD order. Pt is stable @ D/C and has no other needs at this time. Holli Humbles, RN

## 2014-12-23 NOTE — Discharge Instructions (Signed)
Anterior Cervical Diskectomy and Fusion °Anterior cervical diskectomy and fusion is a surgery that is done on the neck (cervical spine) to take pressure off of the nerves or the spinal cord. It is performed through the front (anterior) part of the neck. During this surgery, the damaged disk that is causing pain, numbness, or weakness is removed. The area where the disk was removed is filled with a plastic spacer implant, a bone graft, or both. These implants and bone grafts take pressure off of the nerves and spinal cord by making more room for the nerves to leave the spine. °LET YOUR HEALTH CARE PROVIDER KNOW ABOUT: °· Any allergies you have. °· All medicines you are taking, including vitamins, herbs, eye drops, creams, and over-the-counter medicines. °· Previous problems you or members of your family have had with the use of anesthetics. °· Any blood disorders you have. °· Previous surgeries you have had. °· Any medical conditions you may have. °RISKS AND COMPLICATIONS °Generally, this is a safe procedure. However, problems may occur, including: °· Infection. °· Bleeding with the possible need for blood transfusion. °· Injury to surrounding structures, including nerves. °· Leakage of fluid from the brain or spinal cord (cerebrospinal fluid). °· Blood clots. °· Temporary breathing difficulties after surgery. °BEFORE THE PROCEDURE °· Follow your health care provider's instructions about eating or drinking restrictions. °· Ask your health care provider about: °¨ Changing or stopping your regular medicines. This is especially important if you are taking diabetes medicines or blood thinners. °¨ Taking medicines such as aspirin and ibuprofen. These medicines can thin your blood. Do not take these medicines before your procedure if your health care provider instructs you not to. °· You may be given antibiotic medicines to help prevent infection. °· Your incision site may be marked on your neck. °PROCEDURE °· An IV tube  will be inserted into one of your veins. °· You will be given one or more of the following: °¨ A medicine that helps you relax (sedative). °¨ A medicine that makes you fall asleep (general anesthetic). °· A breathing tube will be placed. °· Your neck will be cleaned with a germ-killing solution (antiseptic). °· Your surgeon will make an incision on the front of your neck, usually within a skinfold line. °· Your neck muscles will be spread apart, and the damaged disk and bone spurs will be removed. °· The area where the disk was removed will be filled with a small plastic spacer implant, a bone graft, or both. °· Your surgeon may put metal plates and screws (hardware) in your neck. This helps to stabilize the surgical site and keep implants and bone grafts in place. The hardware reduces motion at the surgical site so your bones can grow together (fuse). This provides extra support to your neck. °· The incision will be closed with stitches (sutures). °· A bandage (dressing) will be applied to cover the incision. °The procedure may vary among health care providers and hospitals. °AFTER THE PROCEDURE °· Your blood pressure, heart rate, breathing rate, and blood oxygen level will be monitored often until the medicines you were given have worn off.  °· You will be monitored for any signs of complications from the procedure, such as: °¨ Too much bleeding from the incision site. °¨ A buildup of blood under your skin at the surgical site. °¨ Difficulty breathing. °· You may continue to receive antibiotics. °· You can start to eat as soon as you feel comfortable. °· You may be given   a neck brace to wear after surgery. This brace limits your neck movement while your bones are fusing together. Follow your health care provider's instructions about how often and how long you need to wear this. °  °This information is not intended to replace advice given to you by your health care provider. Make sure you discuss any questions you  have with your health care provider. °  °Document Released: 01/24/2009 Document Revised: 02/26/2014 Document Reviewed: 01/24/2009 °Elsevier Interactive Patient Education ©2016 [employer] ° ° ° °

## 2014-12-23 NOTE — Op Note (Signed)
David [name], David [name] NO.:  [id number]  MEDICAL RECORD NO.:  [id number]  LOCATION:  3C09C                        FACILITY:  [facility]  PHYSICIAN:  Phylliss Bob, MD      DATE OF BIRTH:  [birth year]  DATE OF PROCEDURE:  12/22/2014                              OPERATIVE REPORT   PREOPERATIVE DIAGNOSES: 1. Multilevel cervical degenerative disk disease. 2. Varying degrees of neuroforaminal stenosis bilaterally from C3-C7. 3. Spinal cord compression, C6-7. 4. Bilateral cervical radiculopathy, right greater than left.  POSTOPERATIVE DIAGNOSES: 1. Multilevel cervical degenerative disk disease. 2. Varying degrees of neuroforaminal stenosis bilaterally from C3-C7. 3. Spinal cord compression, C6-7. 4. Bilateral cervical radiculopathy, right greater than left.  PROCEDURES: 1. Anterior cervical decompression and fusion, C3-4, C4-5, C5-6, C6-7. 2. Placement of interbody device x4 (Titan intervertebral spacers). 3. Placement of anterior instrumentation, C3-C7. 4. Use of morselized allograft - DBX mix. 5. Intraoperative use of fluoroscopy.  SURGEON:  Phylliss Bob, MD.  ASSISTANTPricilla Holm, PA-C.  ANESTHESIA:  General endotracheal anesthesia.  COMPLICATIONS:  None.  DISPOSITION:  Stable.  ESTIMATED BLOOD LOSS:  Minimal.  INDICATIONS FOR SURGERY:  Briefly, David [name] is a very pleasant 64 year old male, who did present to me with pain in his bilateral arms, right greater than left.  The patient's MRI was clearly notable for substantial pathology from C3-C7.  Specifically, there was spinal cord compression noted at C6-7, and there was bilateral neuroforaminal stenosis to varying degrees from C3-C7.  Given the patient's ongoing pain and lack of improvement with multiple appropriate forms of conservative care, we did discuss proceeding with the procedure reflected above.  The patient did elect to proceed.  OPERATIVE DETAILS:  On December 22, 2014, the patient was  brought to Surgery and general endotracheal anesthesia was administered.  The patient was placed supine on the hospital bed.  The neck was gently extended.  The patient's arms were secured to the sides.  All bony prominences were padded, including the region of the ulnar nerve at the medial elbow.  The neck was then prepped and draped in the usual sterile fashion.  A time-out procedure was performed.  After performing a time- out procedure, a left-sided oblique incision was made, just anterior to the sternocleidomastoid muscle.  The platysma was sharply incised.  A Smith-Robinson approach was utilized and the anterior spine was noted. I then proceeded with subperiosteally exposing the vertebral bodies from C3-C7.  There were prominent bone spurs noted at each level, particularly at C5-6 and C6-7.  These were removed using a rongeur.  I then placed a self-retaining retractor.  I then placed Caspar pins into the C6 and C7 vertebral bodies and distraction was applied.  I then proceeded with a thorough and complete C6-7 intervertebral diskectomy. There was prominent bilateral neuroforaminal stenosis and spinal cord compression, which was appropriately addressed and removed.  The decompression was confirmed using a nerve hook.  After preparing the endplates, the appropriate-sized interbody spacer was packed with DBX mix and tamped into position in the usual fashion.  The lower Caspar pin was removed and bone wax was placed in its place.  A new Caspar pin was then placed into  the C5 vertebral body and distraction was again applied.  Once again, an intervertebral diskectomy and bilateral neuroforaminal decompression were performed and confirmed as previously described.  The endplates were then prepared and the appropriate-sized interbody spacer was packed with the DBX mix and tamped into position. The lower Caspar pin was removed.  Then, a new Caspar pin was placed into the C4 vertebral body.   Once again, distraction was applied, and once again, a thorough and complete intervertebral diskectomy and bilateral neuroforaminal decompression were performed and confirmed using a nerve hook.  After preparing the endplates, the appropriate- sized interbody spacer was packed with DBX mix and tamped into position. Once again, the lower Caspar pin was removed and bone wax was placed in its place.  A new Caspar pin was placed into the C3 vertebral body and distraction was again applied.  Once again, a thorough diskectomy was performed and a bilateral neuroforaminal decompression was performed and confirmed using a nerve hook.  After preparing the endplates, the appropriate-sized interbody spacer was packed with DBX mix and tamped into position in the usual fashion.  I was very pleased with the press- fit of each of the implants.  I did use AP and lateral fluoroscopy, and again, was pleased with the appearance of the implants.  The appropriate- sized anterior cervical plate was then placed over the anterior cervical spine.  The 14-mm screws, two in each vertebral body from C3-C7 for a total of 10 vertebral body screws were placed.  The screws were then locked to the plate using the CAM locking mechanism.  I was very pleased with the final AP and lateral fluoroscopic images.  The wound was then copiously irrigated.  The platysma was then closed using 2-0 Vicryl and the skin was closed using 3-0 Monocryl.  Benzoin and Steri-Strips were applied followed by sterile dressing.  All instrument counts were correct at the termination of the procedure.  Of note, Pricilla Holm was my assistant throughout surgery, and did aid in retraction, suctioning, and closure throughout the surgery.     Phylliss Bob, MD     MD/MEDQ  D:  12/22/2014  T:  12/23/2014  Job:  [id number]  cc:   David [name], M.D.

## 2014-12-23 NOTE — Anesthesia Postprocedure Evaluation (Signed)
  Anesthesia Post-op Note  Patient: David [name]  Procedure(s) Performed: Procedure(s) with comments: ANTERIOR CERVICAL DECOMPRESSION/DISCECTOMY FUSION 4 LEVELS (N/A) - Anteriro cervical decompression fusion, cervical 3-4, cervical 4-5, cervical 5-6, cervical 6-7 with instrumentation and allograft  Patient Location: PACU  Anesthesia Type:General  Level of Consciousness: awake  Airway and Oxygen Therapy: Patient Spontanous Breathing  Post-op Pain: mild  Post-op Assessment: Post-op Vital signs reviewed LLE Motor Response: Purposeful movement LLE Sensation: Full sensation RLE Motor Response: Purposeful movement RLE Sensation: Full sensation      Post-op Vital Signs: Reviewed  Last Vitals:  Filed Vitals:   12/23/14 0811  BP: 150/78  Pulse: 79  Temp: 36.7 C  Resp: 20    Complications: No apparent anesthesia complications

## 2014-12-23 NOTE — Progress Notes (Signed)
Orthopedic Tech Progress Note Patient Details:  David [name] May 14, 1950 [id number]  Ortho Devices Type of Ortho Device: [place] cervical collar Ortho Device/Splint Location: neck Ortho Device/Splint Interventions: Loanne Drilling, Nallely Yost 12/23/2014, 8:15 AM

## 2014-12-23 NOTE — Progress Notes (Signed)
    Patient doing well Patient denies R arm pain Minimal intermittent L hand pain Minimal neck discomfort Has been tolerating PO   Physical Exam: Filed Vitals:   12/23/14 0400  BP: 124/77  Pulse: 76  Temp: 98.4 F (36.9 C)  Resp: 18    Dressing in place NVI Neck soft/supple  POD #1 s/p C3-7 ACDF  - encourage ambulation - Percocet for pain, Valium for muscle spasms - likely d/c home today

## 2014-12-28 ENCOUNTER — Other Ambulatory Visit: Payer: Self-pay | Admitting: Physician Assistant

## 2014-12-28 ENCOUNTER — Ambulatory Visit
Admission: RE | Admit: 2014-12-28 | Discharge: 2014-12-28 | Disposition: A | Discharge status: Home / Self Care | Payer: Commercial Managed Care - PPO | Source: Ambulatory Visit | Attending: Physician Assistant | Admitting: Physician Assistant

## 2014-12-28 DIAGNOSIS — R0602 Shortness of breath: Secondary | ICD-10-CM

## 2014-12-29 NOTE — Discharge Summary (Signed)
Patient ID: David [name] MRN: [id number] DOB/AGE: 07-27-1950 64 y.o.  Admit date: 12/22/2014 Discharge date: 12/23/2014  Admission Diagnoses:  Active Problems:   Radiculopathy   Discharge Diagnoses:  Same  Past Medical History  Diagnosis Date  . OSA (obstructive sleep apnea)     BIPAP  . Knee pain, right     PARTICULARY AT NIGHT  . Back pain, chronic   . GERD (gastroesophageal reflux disease)   . Hyperlipidemia     INTOLERANT OF STATINS  . Palpitations   . History of PSVT (paroxysmal supraventricular tachycardia) 2009    nonsustained atach per Dr Marlou Porch on monitor in 2009. treated with diltiazem  . Chronic low back pain   . Chronic knee pain   . CAD (coronary artery disease) 2009    nonobstructive by cath  . Near syncope 07/23/2014  . Hypertension   . Dysrhythmia     Surgeries: Procedure(s): ANTERIOR CERVICAL DECOMPRESSION/DISCECTOMY FUSION 4 LEVELS C3-7 on 12/22/2014   Consultants:  None  Discharged Condition: Improved  Hospital Course: David [name] is an 64 y.o. male who was admitted 12/22/2014 for operative treatment of radiculopathy. Patient has severe unremitting pain that affects sleep, daily activities, and work/hobbies. After pre-op clearance the patient was taken to the operating room on 12/22/2014 and underwent  Procedure(s): ANTERIOR CERVICAL DECOMPRESSION/DISCECTOMY FUSION 4 LEVELS C3-7.    Patient was given perioperative antibiotics:  Anti-infectives    Start     Dose/Rate Route Frequency Ordered Stop   12/22/14 1730  ceFAZolin (ANCEF) IVPB 1 g/50 mL premix     1 g 100 mL/hr over 30 Minutes Intravenous Every 8 hours 12/22/14 1711 12/23/14 0214   12/22/14 1255  ceFAZolin (ANCEF) IVPB 2 g/50 mL premix     2 g 100 mL/hr over 30 Minutes Intravenous  Once 12/22/14 1253 12/22/14 1255   12/22/14 0800  ceFAZolin (ANCEF) IVPB 2 g/50 mL premix     2 g 100 mL/hr over 30 Minutes Intravenous To ShortStay Surgical 12/21/14 1355 12/22/14 0855        Patient was given sequential compression devices, early ambulation to prevent DVT.  Patient benefited maximally from hospital stay and there were no complications.    Recent vital signs: BP 150/78 mmHg  Pulse 79  Temp(Src) 98 F (36.7 C) (Oral)  Resp 20  Ht 5' 10.5" (1.791 m)  Wt 86.32 kg (190 lb 4.8 oz)  BMI 26.91 kg/m2  SpO2 96%  Discharge Medications:     Medication List    STOP taking these medications        acetaminophen 500 MG tablet  Commonly known as:  TYLENOL      TAKE these medications        aspirin EC 81 MG tablet  Take 81 mg by mouth daily.     diltiazem 180 MG 24 hr capsule  Commonly known as:  DILACOR XR  Take 180 mg by mouth 2 (two) times daily.     esomeprazole 40 MG capsule  Commonly known as:  NEXIUM  Take 40 mg by mouth daily.     multivitamin with minerals tablet  Take 1 tablet by mouth daily.     rosuvastatin 10 MG tablet  Commonly known as:  CRESTOR  Take 10 mg by mouth at bedtime.        Diagnostic Studies: Dg Chest 2 View  12/28/2014  CLINICAL DATA:  Shortness of breath.  Cough. EXAM: CHEST  2 VIEW COMPARISON:  None. FINDINGS: Mediastinum and  hilar structures normal. Lungs are clear. Heart size normal. No pleural effusion or pneumothorax. No acute bony abnormality. Cervical spine fusion. IMPRESSION: No acute cardiopulmonary disease. Electronically Signed   By: Marcello Moores  Register   On: 12/28/2014 10:36   Dg Cervical Spine 1 View  12/22/2014  CLINICAL DATA:  Cervical spine fusion. EXAM: DG CERVICAL SPINE - 1 VIEW; DG C-ARM GT 120 MIN COMPARISON:  MRI 07/08/2014. FINDINGS: Prior C3 through C7 anterior interbody fusion. Good anatomic alignment. Hardware intact. One image obtained. Fluoroscopic time 0 minutes 8 seconds. IMPRESSION: C3 through C7 anterior and interbody fusion . Electronically Signed   By: Marcello Moores  Register   On: 12/22/2014 13:20   Dg C-arm Gt 120 Min  12/22/2014  CLINICAL DATA:  Cervical spine fusion. EXAM: DG CERVICAL SPINE  - 1 VIEW; DG C-ARM GT 120 MIN COMPARISON:  MRI 07/08/2014. FINDINGS: Prior C3 through C7 anterior interbody fusion. Good anatomic alignment. Hardware intact. One image obtained. Fluoroscopic time 0 minutes 8 seconds. IMPRESSION: C3 through C7 anterior and interbody fusion . Electronically Signed   By: Marcello Moores  Register   On: 12/22/2014 13:20    Disposition: 01-Home or Self Care   POD #1 s/p C3-7 ACDF  - encourage ambulation - Percocet for pain, Valium for muscle spasms -Written scripts for pain signed and in chart -D/C instructions sheet printed and in chart -D/C today  -F/U in office 2 weeks   Signed: Justice Britain 12/29/2014, 1:44 PM

## 2016-12-06 ENCOUNTER — Other Ambulatory Visit: Payer: Self-pay | Admitting: Family Medicine

## 2016-12-06 DIAGNOSIS — Z136 Encounter for screening for cardiovascular disorders: Secondary | ICD-10-CM

## 2016-12-18 ENCOUNTER — Ambulatory Visit
Admission: RE | Admit: 2016-12-18 | Discharge: 2016-12-18 | Disposition: A | Discharge status: Home / Self Care | Payer: Commercial Managed Care - PPO | Source: Ambulatory Visit | Attending: Family Medicine | Admitting: Family Medicine

## 2016-12-18 DIAGNOSIS — Z136 Encounter for screening for cardiovascular disorders: Secondary | ICD-10-CM

## 2017-08-14 DIAGNOSIS — M1812 Unilateral primary osteoarthritis of first carpometacarpal joint, left hand: Secondary | ICD-10-CM | POA: Diagnosis not present

## 2017-09-12 DIAGNOSIS — R109 Unspecified abdominal pain: Secondary | ICD-10-CM | POA: Diagnosis not present

## 2017-09-12 DIAGNOSIS — R112 Nausea with vomiting, unspecified: Secondary | ICD-10-CM | POA: Diagnosis not present

## 2017-09-15 ENCOUNTER — Emergency Department (HOSPITAL_COMMUNITY): Payer: Medicare Other

## 2017-09-15 ENCOUNTER — Inpatient Hospital Stay (HOSPITAL_COMMUNITY)
Admission: RE | Admit: 2017-09-15 | Discharge: 2017-09-18 | DRG: 389 | Disposition: A | Discharge status: Home / Self Care | Payer: Medicare Other | Attending: Internal Medicine | Admitting: Internal Medicine

## 2017-09-15 ENCOUNTER — Encounter (HOSPITAL_COMMUNITY): Payer: Self-pay | Admitting: Nurse Practitioner

## 2017-09-15 DIAGNOSIS — N179 Acute kidney failure, unspecified: Secondary | ICD-10-CM | POA: Diagnosis present

## 2017-09-15 DIAGNOSIS — Z833 Family history of diabetes mellitus: Secondary | ICD-10-CM

## 2017-09-15 DIAGNOSIS — K5651 Intestinal adhesions [bands], with partial obstruction: Secondary | ICD-10-CM | POA: Diagnosis present

## 2017-09-15 DIAGNOSIS — K529 Noninfective gastroenteritis and colitis, unspecified: Secondary | ICD-10-CM | POA: Diagnosis present

## 2017-09-15 DIAGNOSIS — G8929 Other chronic pain: Secondary | ICD-10-CM | POA: Diagnosis present

## 2017-09-15 DIAGNOSIS — I2510 Atherosclerotic heart disease of native coronary artery without angina pectoris: Secondary | ICD-10-CM | POA: Diagnosis present

## 2017-09-15 DIAGNOSIS — Z9049 Acquired absence of other specified parts of digestive tract: Secondary | ICD-10-CM

## 2017-09-15 DIAGNOSIS — Z9861 Coronary angioplasty status: Secondary | ICD-10-CM

## 2017-09-15 DIAGNOSIS — Z981 Arthrodesis status: Secondary | ICD-10-CM

## 2017-09-15 DIAGNOSIS — Z87891 Personal history of nicotine dependence: Secondary | ICD-10-CM | POA: Diagnosis not present

## 2017-09-15 DIAGNOSIS — Z8249 Family history of ischemic heart disease and other diseases of the circulatory system: Secondary | ICD-10-CM | POA: Diagnosis not present

## 2017-09-15 DIAGNOSIS — R1084 Generalized abdominal pain: Secondary | ICD-10-CM | POA: Diagnosis not present

## 2017-09-15 DIAGNOSIS — Z800 Family history of malignant neoplasm of digestive organs: Secondary | ICD-10-CM

## 2017-09-15 DIAGNOSIS — E785 Hyperlipidemia, unspecified: Secondary | ICD-10-CM | POA: Diagnosis present

## 2017-09-15 DIAGNOSIS — E860 Dehydration: Secondary | ICD-10-CM | POA: Diagnosis not present

## 2017-09-15 DIAGNOSIS — G4733 Obstructive sleep apnea (adult) (pediatric): Secondary | ICD-10-CM | POA: Diagnosis present

## 2017-09-15 DIAGNOSIS — I471 Supraventricular tachycardia: Secondary | ICD-10-CM | POA: Diagnosis present

## 2017-09-15 DIAGNOSIS — I10 Essential (primary) hypertension: Secondary | ICD-10-CM | POA: Diagnosis present

## 2017-09-15 DIAGNOSIS — K56600 Partial intestinal obstruction, unspecified as to cause: Secondary | ICD-10-CM | POA: Diagnosis not present

## 2017-09-15 DIAGNOSIS — Z7982 Long term (current) use of aspirin: Secondary | ICD-10-CM | POA: Diagnosis not present

## 2017-09-15 DIAGNOSIS — R1110 Vomiting, unspecified: Secondary | ICD-10-CM | POA: Diagnosis not present

## 2017-09-15 DIAGNOSIS — M545 Low back pain: Secondary | ICD-10-CM | POA: Diagnosis present

## 2017-09-15 DIAGNOSIS — Z79899 Other long term (current) drug therapy: Secondary | ICD-10-CM | POA: Diagnosis not present

## 2017-09-15 DIAGNOSIS — R112 Nausea with vomiting, unspecified: Secondary | ICD-10-CM | POA: Diagnosis not present

## 2017-09-15 DIAGNOSIS — I700 Atherosclerosis of aorta: Secondary | ICD-10-CM | POA: Diagnosis present

## 2017-09-15 DIAGNOSIS — Z8349 Family history of other endocrine, nutritional and metabolic diseases: Secondary | ICD-10-CM | POA: Diagnosis not present

## 2017-09-15 DIAGNOSIS — K56609 Unspecified intestinal obstruction, unspecified as to partial versus complete obstruction: Secondary | ICD-10-CM | POA: Diagnosis not present

## 2017-09-15 DIAGNOSIS — K219 Gastro-esophageal reflux disease without esophagitis: Secondary | ICD-10-CM | POA: Diagnosis present

## 2017-09-15 DIAGNOSIS — M25561 Pain in right knee: Secondary | ICD-10-CM | POA: Diagnosis present

## 2017-09-15 DIAGNOSIS — K5900 Constipation, unspecified: Secondary | ICD-10-CM | POA: Diagnosis not present

## 2017-09-15 DIAGNOSIS — Z823 Family history of stroke: Secondary | ICD-10-CM | POA: Diagnosis not present

## 2017-09-15 DIAGNOSIS — K56699 Other intestinal obstruction unspecified as to partial versus complete obstruction: Secondary | ICD-10-CM | POA: Diagnosis not present

## 2017-09-15 DIAGNOSIS — Z9852 Vasectomy status: Secondary | ICD-10-CM | POA: Diagnosis not present

## 2017-09-15 DIAGNOSIS — I493 Ventricular premature depolarization: Secondary | ICD-10-CM | POA: Diagnosis present

## 2017-09-15 LAB — CBC WITH DIFFERENTIAL/PLATELET
Basophils Absolute: 0 10*3/uL (ref 0.0–0.1)
Basophils Relative: 0 %
Eosinophils Absolute: 0 10*3/uL (ref 0.0–0.7)
Eosinophils Relative: 1 %
HCT: 44.2 % (ref 39.0–52.0)
Hemoglobin: 15.9 g/dL (ref 13.0–17.0)
Lymphocytes Relative: 20 %
Lymphs Abs: 1.3 10*3/uL (ref 0.7–4.0)
MCH: 30.8 pg (ref 26.0–34.0)
MCHC: 36 g/dL (ref 30.0–36.0)
MCV: 85.7 fL (ref 78.0–100.0)
Monocytes Absolute: 1.1 10*3/uL — ABNORMAL HIGH (ref 0.1–1.0)
Monocytes Relative: 17 %
Neutro Abs: 4 10*3/uL (ref 1.7–7.7)
Neutrophils Relative %: 62 %
Platelets: 293 10*3/uL (ref 150–400)
RBC: 5.16 MIL/uL (ref 4.22–5.81)
RDW: 12.2 % (ref 11.5–15.5)
WBC: 6.4 10*3/uL (ref 4.0–10.5)

## 2017-09-15 LAB — URINALYSIS, ROUTINE W REFLEX MICROSCOPIC
Bilirubin Urine: NEGATIVE
Glucose, UA: NEGATIVE mg/dL
Hgb urine dipstick: NEGATIVE
Ketones, ur: 5 mg/dL — AB
Leukocytes, UA: NEGATIVE
Nitrite: NEGATIVE
Protein, ur: NEGATIVE mg/dL
Specific Gravity, Urine: 1.02 (ref 1.005–1.030)
pH: 5 (ref 5.0–8.0)

## 2017-09-15 LAB — COMPREHENSIVE METABOLIC PANEL
ALT: 31 U/L (ref 0–44)
AST: 27 U/L (ref 15–41)
Albumin: 4.1 g/dL (ref 3.5–5.0)
Alkaline Phosphatase: 40 U/L (ref 38–126)
Anion gap: 18 — ABNORMAL HIGH (ref 5–15)
BUN: 67 mg/dL — ABNORMAL HIGH (ref 8–23)
CO2: 29 mmol/L (ref 22–32)
Calcium: 9.3 mg/dL (ref 8.9–10.3)
Chloride: 89 mmol/L — ABNORMAL LOW (ref 98–111)
Creatinine, Ser: 2.33 mg/dL — ABNORMAL HIGH (ref 0.61–1.24)
GFR calc Af Amer: 32 mL/min — ABNORMAL LOW (ref >60)
GFR calc non Af Amer: 27 mL/min — ABNORMAL LOW (ref >60)
Glucose, Bld: 121 mg/dL — ABNORMAL HIGH (ref 70–99)
Potassium: 3.8 mmol/L (ref 3.5–5.1)
Sodium: 136 mmol/L (ref 135–145)
Total Bilirubin: 1.6 mg/dL — ABNORMAL HIGH (ref 0.3–1.2)
Total Protein: 7.7 g/dL (ref 6.5–8.1)

## 2017-09-15 LAB — LACTIC ACID, PLASMA: Lactic Acid, Venous: 1.1 mmol/L (ref 0.5–1.9)

## 2017-09-15 LAB — LIPASE, BLOOD: Lipase: 48 U/L (ref 11–51)

## 2017-09-15 MED ORDER — DIATRIZOATE MEGLUMINE & SODIUM 66-10 % PO SOLN
90.0000 mL | Freq: Once | ORAL | Status: AC
  Administered 2017-09-15: 90 mL via ORAL
  Filled 2017-09-15: qty 90

## 2017-09-15 MED ORDER — METOCLOPRAMIDE HCL 5 MG/ML IJ SOLN
10.0000 mg | Freq: Once | INTRAMUSCULAR | Status: AC
  Administered 2017-09-15: 10 mg via INTRAVENOUS
  Filled 2017-09-15: qty 2

## 2017-09-15 MED ORDER — MORPHINE SULFATE (PF) 4 MG/ML IV SOLN
4.0000 mg | Freq: Once | INTRAVENOUS | Status: DC
  Filled 2017-09-15: qty 1

## 2017-09-15 MED ORDER — ENOXAPARIN SODIUM 30 MG/0.3ML ~~LOC~~ SOLN
30.0000 mg | Freq: Every day | SUBCUTANEOUS | Status: DC
  Administered 2017-09-15 – 2017-09-16 (×2): 30 mg via SUBCUTANEOUS
  Filled 2017-09-15 (×2): qty 0.3

## 2017-09-15 MED ORDER — DIATRIZOATE MEGLUMINE & SODIUM 66-10 % PO SOLN: 90.0000 mL | Freq: Once | ORAL | Status: DC

## 2017-09-15 MED ORDER — FAMOTIDINE IN NACL 20-0.9 MG/50ML-% IV SOLN
20.0000 mg | Freq: Two times a day (BID) | INTRAVENOUS | Status: DC
Start: 2017-09-15 — End: 2017-09-18
  Administered 2017-09-15 – 2017-09-18 (×6): 20 mg via INTRAVENOUS
  Filled 2017-09-15 (×6): qty 50

## 2017-09-15 MED ORDER — SODIUM CHLORIDE 0.9 % IV BOLUS
1000.0000 mL | Freq: Once | INTRAVENOUS | Status: AC
  Administered 2017-09-15: 1000 mL via INTRAVENOUS

## 2017-09-15 MED ORDER — ONDANSETRON HCL 4 MG/2ML IJ SOLN
4.0000 mg | Freq: Four times a day (QID) | INTRAMUSCULAR | Status: DC | PRN
  Administered 2017-09-15 – 2017-09-17 (×3): 4 mg via INTRAVENOUS
  Filled 2017-09-15 (×3): qty 2

## 2017-09-15 MED ORDER — DILTIAZEM HCL ER COATED BEADS 180 MG PO CP24
180.0000 mg | ORAL_CAPSULE | Freq: Two times a day (BID) | ORAL | Status: DC
  Administered 2017-09-16 – 2017-09-18 (×5): 180 mg via ORAL
  Filled 2017-09-15 (×5): qty 1

## 2017-09-15 MED ORDER — SODIUM CHLORIDE 0.9 % IV SOLN
INTRAVENOUS | Status: DC
  Administered 2017-09-15 – 2017-09-16 (×3): via INTRAVENOUS
  Administered 2017-09-17: 1000 mL via INTRAVENOUS

## 2017-09-15 NOTE — H&P (Signed)
History and Physical    [place]oy [place] RFF:[id number] DOB: [birth year] DOA: 09/15/2017  PCP: Maury Dus, MD  Patient coming from: Home.   I have personally briefly reviewed patient's old medical records in [place]  Chief Complaint: nausea, vomiting and abdominal pain.   HPI: David [name] is a 67 y.o. male with medical history significant of nausea, vomiting and abdominal pain since Thursday. He was seen at [facility] center and was referred to [facility] ED.  Pt reports that he did not eat anything since Thursday, because of persistent nausea, vomiting and generalized abdominal pain. He denies any fevers or chills. He denies similar complaints in the past.  He reports having a cholecystectomy in 2006. He denies any other abdominal surgeries.  No chest pain or sob or cough. No dysuria, or back pain.   ED Course: labs were remarkable for elevated creatinine of 2.3 , BUN 67 , normal lactic ancid and normal cbc, CT abdomen and pelvis showed SBO transition point noted left mid abdomen at the mid ileal level. He was referred to medical service for admission and surgery consulted.   Review of Systems: As per HPI otherwise 10 point review of systems negative.    Past Medical History:  Diagnosis Date  . Back pain, chronic   . CAD (coronary artery disease) 2009   nonobstructive by cath  . Chronic knee pain   . Chronic low back pain   . Dysrhythmia   . GERD (gastroesophageal reflux disease)   . History of PSVT (paroxysmal supraventricular tachycardia) 2009   nonsustained atach per Dr Marlou Porch on monitor in 2009. treated with diltiazem  . Hyperlipidemia    INTOLERANT OF STATINS  . Hypertension   . Knee pain, right    PARTICULARY AT NIGHT  . Near syncope 07/23/2014  . OSA (obstructive sleep apnea)    BIPAP  . Palpitations     Past Surgical History:  Procedure Laterality Date  . ANTERIOR CERVICAL DECOMPRESSION/DISCECTOMY FUSION 4 LEVELS N/A 12/22/2014   Procedure: ANTERIOR  CERVICAL DECOMPRESSION/DISCECTOMY FUSION 4 LEVELS;  Surgeon: Phylliss Bob, MD;  Location: [facility];  Service: Orthopedics;  Laterality: N/A;  Anteriro cervical decompression fusion, cervical 3-4, cervical 4-5, cervical 5-6, cervical 6-7 with instrumentation and allograft  . CHOLECYSTECTOMY    . COLONOSCOPY     2003, 2008, 2013  . CORONARY ANGIOPLASTY  07/2007   NONOBSTRUCTIVE CORONARY PLAQUING- MINOR 20 % OSTIAL DIAGONAL STENOSIS, NORMAL LEFT VENTRICULAR EF 55%  . ERCP  2006  . KNEE ARTHROSCOPY Right 1982  . LAPAROSCOPIC CHOLECYSTECTOMY  2006  . VASECTOMY  1980     reports that he quit smoking about 17 years ago. His smoking use included cigarettes. He smoked 2.50 packs per day. He does not have any smokeless tobacco history on file. He reports that he drinks alcohol. He reports that he does not use drugs.  No Known Allergies  Family History  Problem Relation Age of Onset  . Heart attack Mother 7  . CVA Mother 76       AND AGAIN AT 26  . Colon cancer Father   . Pneumonia Father        WITH SEPSIS  . Heart attack Father   . Diabetes Maternal Grandmother   . Hyperlipidemia Sister   . Hypertension Sister   . Stroke Brother 10  . Hypertension Brother   . Dysrhythmia Brother        and DIZZINESS  . GER disease Sister    Reviewed and  not pertinent.  Prior to Admission medications   Medication Sig Start Date End Date Taking? Authorizing Provider  aspirin EC 81 MG tablet Take 81 mg by mouth daily.   Yes [provider]  diltiazem (DILACOR XR) 180 MG 24 hr capsule Take 180 mg by mouth 2 (two) times daily.    Yes [provider]  esomeprazole (NEXIUM) 40 MG capsule Take 40 mg by mouth daily.    Yes [provider]  Multiple Vitamins-Minerals (MULTIVITAMIN WITH MINERALS) tablet Take 1 tablet by mouth daily.   Yes [provider]  rosuvastatin (CRESTOR) 10 MG tablet Take 10 mg by mouth at bedtime.    Yes [provider]  ondansetron  (ZOFRAN-ODT) 4 MG disintegrating tablet PUT 1 TABLET UNDER TOUNGE 3 TIMES A DAY AS NEEDED FOR NAUSEA 09/12/17   [provider]    Physical Exam: Vitals:   09/15/17 1525 09/15/17 1551 09/15/17 1842  BP: 123/86 130/84 129/80  Pulse:   100  Resp: 20 16 14   Temp: 98.3 F (36.8 C)    TempSrc: Oral    SpO2: 98% 97% 93%  Weight: 75.3 kg (166 lb)      Constitutional: NAD, calm, comfortable Vitals:   09/15/17 1525 09/15/17 1551 09/15/17 1842  BP: 123/86 130/84 129/80  Pulse:   100  Resp: 20 16 14   Temp: 98.3 F (36.8 C)    TempSrc: Oral    SpO2: 98% 97% 93%  Weight: 75.3 kg (166 lb)     Eyes: PERRL, lids and conjunctivae normal ENMT: Mucous membranes are moist. Posterior pharynx clear of any exudate or lesions.Normal dentition.  Neck: normal, supple, no masses, no thyromegaly Respiratory: clear to auscultation bilaterally, no wheezing, no crackles. Normal respiratory effort. No accessory muscle use.  Cardiovascular: Regular rate and rhythm, no murmurs / rubs / gallops. No extremity edema. 2+ pedal pulses. No carotid bruits.  Abdomen: no tenderness, no masses palpated. No hepatosplenomegaly. Bowel sounds positive.  Musculoskeletal: no clubbing / cyanosis. No joint deformity upper and lower extremities. Good ROM, no contractures. Normal muscle tone.  Skin: no rashes, lesions, ulcers. No induration Neurologic: CN 2-12 grossly intact. Sensation intact, DTR normal. Strength 5/5 in all 4.  Psychiatric: Normal judgment and insight. Alert and oriented x 3. Normal mood.     Labs on Admission: I have personally reviewed following labs and imaging studies  CBC: Recent Labs  Lab 09/15/17 1628  WBC 6.4  NEUTROABS 4.0  HGB 15.9  HCT 44.2  MCV 85.7  PLT 973   Basic Metabolic Panel: Recent Labs  Lab 09/15/17 1628  NA 136  K 3.8  CL 89*  CO2 29  GLUCOSE 121*  BUN 67*  CREATININE 2.33*  CALCIUM 9.3   GFR: CrCl cannot be calculated (Unknown ideal weight.). Liver  Function Tests: Recent Labs  Lab 09/15/17 1628  AST 27  ALT 31  ALKPHOS 40  BILITOT 1.6*  PROT 7.7  ALBUMIN 4.1   Recent Labs  Lab 09/15/17 1628  LIPASE 48   No results for input(s): AMMONIA in the last 168 hours. Coagulation Profile: No results for input(s): INR, PROTIME in the last 168 hours. Cardiac Enzymes: No results for input(s): CKTOTAL, CKMB, CKMBINDEX, TROPONINI in the last 168 hours. BNP (last 3 results) No results for input(s): PROBNP in the last 8760 hours. HbA1C: No results for input(s): HGBA1C in the last 72 hours. CBG: No results for input(s): GLUCAP in the last 168 hours. Lipid Profile: No results for input(s):  CHOL, HDL, LDLCALC, TRIG, CHOLHDL, LDLDIRECT in the last 72 hours. Thyroid Function Tests: No results for input(s): TSH, T4TOTAL, FREET4, T3FREE, THYROIDAB in the last 72 hours. Anemia Panel: No results for input(s): VITAMINB12, FOLATE, FERRITIN, TIBC, IRON, RETICCTPCT in the last 72 hours. Urine analysis:    Component Value Date/Time   COLORURINE YELLOW 09/15/2017 [place] 09/15/2017 1629   LABSPEC 1.020 09/15/2017 1629   PHURINE 5.0 09/15/2017 1629   GLUCOSEU NEGATIVE 09/15/2017 1629   HGBUR NEGATIVE 09/15/2017 1629   BILIRUBINUR NEGATIVE 09/15/2017 1629   KETONESUR 5 (A) 09/15/2017 1629   PROTEINUR NEGATIVE 09/15/2017 1629   UROBILINOGEN 0.2 12/20/2014 1402   NITRITE NEGATIVE 09/15/2017 1629   LEUKOCYTESUR NEGATIVE 09/15/2017 1629    Radiological Exams on Admission: Ct Abdomen Pelvis Wo Contrast  Result Date: 09/15/2017 CLINICAL DATA:  Nausea and vomiting. Suspected small bowel obstruction. EXAM: CT ABDOMEN AND PELVIS WITHOUT CONTRAST TECHNIQUE: Multidetector CT imaging of the abdomen and pelvis was performed following the standard protocol without IV contrast. COMPARISON:  None. FINDINGS: Lower chest: The lung bases are clear of an acute process. No infiltrates, edema or effusions. No worrisome pulmonary lesions. The  heart is normal in size. No pericardial effusion. Coronary artery calcifications are noted. The distal esophagus is grossly normal. Hepatobiliary: No focal hepatic lesions or intrahepatic biliary dilatation. The gallbladder is surgically absent. No common bile duct dilatation. Pancreas: No mass, inflammation or ductal dilatation. Spleen: Normal size.  No focal lesions. Adrenals/Urinary Tract: The adrenal glands and kidneys are unremarkable. No renal, ureteral or bladder calculi or mass. Stomach/Bowel: The stomach is distended and fluid-filled. The duodenum is also distended and fluid-filled. There is a moderate-sized duodenum diverticulum noted near the pancreatic head. Fairly markedly dilated small bowel with air-fluid levels throughout. Maximum distention is up to 4.5 cm. There is a transition to normal/decompressed small bowel loops in the left mid abdomen best seen on image 49 of series 2 and image 43 of series 4. No masses identified. This is likely due to on adhesive process. The mid distal ileal loops of small bowel appear normal. The terminal ileum is normal. The appendix is normal. The colon is relatively decompressed. Moderate scattered diverticulosis without findings for acute diverticulitis. Vascular/Lymphatic: The aorta is normal in caliber. Moderate atheroscerlotic calcifications. No mesenteric of retroperitoneal mass or adenopathy. Small scattered lymph nodes are noted. Reproductive: The prostate gland and seminal vesicles are unremarkable. Other: No free air or free fluid. Musculoskeletal: No significant bony findings. IMPRESSION: 1. CT findings consistent with a small bowel obstruction with a transition point noted left mid abdomen at the mid ileal level. This is likely due to adhesions. No mass is identified. 2. No free air or free fluid. 3. Status post cholecystectomy.  No biliary dilatation. 4. Age advanced atherosclerotic calcifications involving the aorta and coronary arteries. Electronically  Signed   By: Marijo Sanes M.D.   On: 09/15/2017 17:44    EKG: not done.   Assessment/Plan Active Problems:   SBO (small bowel obstruction) (HCC)  SBO:  Admitted for further evaluation.  NPO for now.  IV fluids, IV anti emetics and pain control as needed.  Surgery consulted.  No NG tube as he is not vomiting.  SBO protocol ordered.    Acute renal failure:  Suspect secondary to dehydration.  Probably pre renal azotemia.  Continue with IV fluids and check renal parameters in am.    PSVT:  Resume cardizem.    GERD  Stable.  Continue  with pepcid.      DVT prophylaxis: lovenox.  Code Status: full code.  Family Communication: multiple family members at bedside.  Disposition Plan: pending clinical improvement and resolution of sbo.  Consults called: surgery DR Rosendo Gros Admission status: med surg /inpatient.    Hosie Poisson MD [facility] Pager [phone]   If 7PM-7AM, please contact night-coverage [email or web address] Password [email or web address]  09/15/2017, 7:44 PM

## 2017-09-15 NOTE — ED Triage Notes (Signed)
Pt presents with c/o N/V ad adds that he has been sent from urgent care where they confirmed he has a small bowel obstruction via KUB.

## 2017-09-15 NOTE — Progress Notes (Signed)
ED TO INPATIENT HANDOFF REPORT  Name/Age/Gender David [name] 67 y.o. male  Code Status Code Status History    Date Active Date Inactive Code Status Order ID Comments User Context   12/22/2014 1712 12/23/2014 1431 Full Code [id number]  Manfred Arch Inpatient   07/23/2014 1935 07/24/2014 2048 Full Code [id number]  Isaiah Serge, NP Inpatient      Home/SNF/Other Home  Chief Complaint Bowel Obstruction   Level of Care/Admitting Diagnosis ED Disposition    ED Disposition Condition [facility] Area: [facility] [[id number]]  Level of Care: Med-Surg [16]  Diagnosis: SBO (small bowel obstruction) ([place]) [[id number]]  Admitting Physician: Hosie Poisson [[id number]]  Attending Physician: Hosie Poisson [[id number]]  Estimated length of stay: past midnight tomorrow  Certification:: I certify this patient will need inpatient services for at least 2 midnights  PT Class (Do Not Modify): Inpatient [101]  PT Acc Code (Do Not Modify): Private [1]       Medical History Past Medical History:  Diagnosis Date  . Back pain, chronic   . CAD (coronary artery disease) 2009   nonobstructive by cath  . Chronic knee pain   . Chronic low back pain   . Dysrhythmia   . GERD (gastroesophageal reflux disease)   . History of PSVT (paroxysmal supraventricular tachycardia) 2009   nonsustained atach per Dr Marlou Porch on monitor in 2009. treated with diltiazem  . Hyperlipidemia    INTOLERANT OF STATINS  . Hypertension   . Knee pain, right    PARTICULARY AT NIGHT  . Near syncope 07/23/2014  . OSA (obstructive sleep apnea)    BIPAP  . Palpitations     Allergies No Known Allergies  IV Location/Drains/Wounds Patient Lines/Drains/Airways Status   Active Line/Drains/Airways    Name:   Placement date:   Placement time:   Site:   Days:   Peripheral IV 09/15/17 Left Forearm   09/15/17    1622    Forearm   less than 1   Incision (Closed) 12/22/14 Neck Other (Comment)   12/22/14     1007     998          Labs/Imaging Results for orders placed or performed during the hospital encounter of 09/15/17 (from the past 48 hour(s))  Comprehensive metabolic panel     Status: Abnormal   Collection Time: 09/15/17  4:28 PM  Result Value Ref Range   Sodium 136 135 - 145 mmol/L   Potassium 3.8 3.5 - 5.1 mmol/L   Chloride 89 (L) 98 - 111 mmol/L   CO2 29 22 - 32 mmol/L   Glucose, Bld 121 (H) 70 - 99 mg/dL   BUN 67 (H) 8 - 23 mg/dL   Creatinine, Ser 2.33 (H) 0.61 - 1.24 mg/dL   Calcium 9.3 8.9 - 10.3 mg/dL   Total Protein 7.7 6.5 - 8.1 g/dL   Albumin 4.1 3.5 - 5.0 g/dL   AST 27 15 - 41 U/L   ALT 31 0 - 44 U/L   Alkaline Phosphatase 40 38 - 126 U/L   Total Bilirubin 1.6 (H) 0.3 - 1.2 mg/dL   GFR calc non Af Amer 27 (L) >60 mL/min   GFR calc Af Amer 32 (L) >60 mL/min    Comment: (NOTE) The eGFR has been calculated using the CKD EPI equation. This calculation has not been validated in all clinical situations. eGFR's persistently <60 mL/min signify possible Chronic Kidney Disease.    Anion gap 18 (  H) 5 - 15    Comment: Performed at [facility], [place] [address], [place], [place] [address]  Lipase, blood     Status: None   Collection Time: 09/15/17  4:28 PM  Result Value Ref Range   Lipase 48 11 - 51 U/L    Comment: Performed at [facility], [place] [address] [place], [place] [address]  CBC with Diff     Status: Abnormal   Collection Time: 09/15/17  4:28 PM  Result Value Ref Range   WBC 6.4 4.0 - 10.5 K/uL   RBC 5.16 4.22 - 5.81 MIL/uL   Hemoglobin 15.9 13.0 - 17.0 g/dL   HCT 44.2 39.0 - 52.0 %   MCV 85.7 78.0 - 100.0 fL   MCH 30.8 26.0 - 34.0 pg   MCHC 36.0 30.0 - 36.0 g/dL   RDW 12.2 11.5 - 15.5 %   Platelets 293 150 - 400 K/uL   Neutrophils Relative % 62 %   Neutro Abs 4.0 1.7 - 7.7 K/uL   Lymphocytes Relative 20 %   Lymphs Abs 1.3 0.7 - 4.0 K/uL   Monocytes Relative 17 %   Monocytes Absolute 1.1 (H) 0.1 - 1.0 K/uL    Eosinophils Relative 1 %   Eosinophils Absolute 0.0 0.0 - 0.7 K/uL   Basophils Relative 0 %   Basophils Absolute 0.0 0.0 - 0.1 K/uL    Comment: Performed at [facility], [place] [address] [place], [place] [address]  Urinalysis, Routine w reflex microscopic     Status: Abnormal   Collection Time: 09/15/17  4:29 PM  Result Value Ref Range   Color, Urine YELLOW YELLOW   APPearance CLEAR CLEAR   Specific Gravity, Urine 1.020 1.005 - 1.030   pH 5.0 5.0 - 8.0   Glucose, UA NEGATIVE NEGATIVE mg/dL   Hgb urine dipstick NEGATIVE NEGATIVE   Bilirubin Urine NEGATIVE NEGATIVE   Ketones, ur 5 (A) NEGATIVE mg/dL   Protein, ur NEGATIVE NEGATIVE mg/dL   Nitrite NEGATIVE NEGATIVE   Leukocytes, UA NEGATIVE NEGATIVE    Comment: Performed at [place] [address] [place], [place] [address]  Lactic acid, plasma     Status: None   Collection Time: 09/15/17  4:29 PM  Result Value Ref Range   Lactic Acid, Venous 1.1 0.5 - 1.9 mmol/L    Comment: Performed at [facility], [place] [address], [place], [place] [address]   Ct Abdomen Pelvis Wo Contrast  Result Date: 09/15/2017 CLINICAL DATA:  Nausea and vomiting. Suspected small bowel obstruction. EXAM: CT ABDOMEN AND PELVIS WITHOUT CONTRAST TECHNIQUE: Multidetector CT imaging of the abdomen and pelvis was performed following the standard protocol without IV contrast. COMPARISON:  None. FINDINGS: Lower chest: The lung bases are clear of an acute process. No infiltrates, edema or effusions. No worrisome pulmonary lesions. The heart is normal in size. No pericardial effusion. Coronary artery calcifications are noted. The distal esophagus is grossly normal. Hepatobiliary: No focal hepatic lesions or intrahepatic biliary dilatation. The gallbladder is surgically absent. No common bile duct dilatation. Pancreas: No mass, inflammation or ductal dilatation. Spleen: Normal size.  No focal lesions. Adrenals/Urinary  Tract: The adrenal glands and kidneys are unremarkable. No renal, ureteral or bladder calculi or mass. Stomach/Bowel: The stomach is distended and fluid-filled. The duodenum is also distended and fluid-filled. There is a moderate-sized duodenum diverticulum noted near the pancreatic head. Fairly markedly dilated small bowel with air-fluid levels throughout. Maximum distention  is up to 4.5 cm. There is a transition to normal/decompressed small bowel loops in the left mid abdomen best seen on image 49 of series 2 and image 43 of series 4. No masses identified. This is likely due to on adhesive process. The mid distal ileal loops of small bowel appear normal. The terminal ileum is normal. The appendix is normal. The colon is relatively decompressed. Moderate scattered diverticulosis without findings for acute diverticulitis. Vascular/Lymphatic: The aorta is normal in caliber. Moderate atheroscerlotic calcifications. No mesenteric of retroperitoneal mass or adenopathy. Small scattered lymph nodes are noted. Reproductive: The prostate gland and seminal vesicles are unremarkable. Other: No free air or free fluid. Musculoskeletal: No significant bony findings. IMPRESSION: 1. CT findings consistent with a small bowel obstruction with a transition point noted left mid abdomen at the mid ileal level. This is likely due to adhesions. No mass is identified. 2. No free air or free fluid. 3. Status post cholecystectomy.  No biliary dilatation. 4. Age advanced atherosclerotic calcifications involving the aorta and coronary arteries. Electronically Signed   By: Marijo Sanes M.D.   On: 09/15/2017 17:44    Pending Labs Unresulted Labs (From admission, onward)   Start     Ordered   09/16/17 0500  CBC  Tomorrow morning,   R     09/15/17 2011   09/16/17 8588  Basic metabolic panel  Tomorrow morning,   R     09/15/17 2011   Signed and Held  HIV antibody (Routine Testing)  Once,   R     Signed and Held   Signed and Held   Creatinine, serum  (enoxaparin (LOVENOX)    CrCl < 30 ml/min)  Weekly,   R    Comments:  while on enoxaparin therapy.    Signed and Held      Vitals/Pain Today's Vitals   09/15/17 1826 09/15/17 1842 09/15/17 1900 09/15/17 1930  BP:  129/80 119/82 130/86  Pulse:  100 93 97  Resp:  _0 Temp:      TempSrc:      SpO2:  93% 94% 96%  Weight:      PainSc: 0-No pain       Isolation Precautions No active isolations  Medications Medications  sodium chloride 0.9 % bolus 1,000 mL (0 mLs Intravenous Stopped 09/15/17 1724)    And  0.9 %  sodium chloride infusion ( Intravenous New Bag/Given 09/15/17 1653)  morphine 4 MG/ML injection 4 mg (4 mg Intravenous Refused 09/15/17 1626)  diatrizoate meglumine-sodium (GASTROGRAFIN) 66-10 % solution 90 mL (has no administration in time range)  ondansetron (ZOFRAN) injection 4 mg (has no administration in time range)  famotidine (PEPCID) IVPB 20 mg premix (has no administration in time range)  metoCLOPramide (REGLAN) injection 10 mg (10 mg Intravenous Given 09/15/17 1624)    Mobility walks

## 2017-09-15 NOTE — ED Notes (Signed)
Report called to receiving RN.

## 2017-09-15 NOTE — ED Provider Notes (Addendum)
[facility] DEPT Provider Note   CSN: [id number] Arrival date & time: 09/15/17  1454     History   Chief Complaint Chief Complaint  Patient presents with  . N/V  . SBO Sent From Urgent Care    HPI David [name] is a 67 y.o. male with h/o HTN, HLD, OSA, here for evaluation of abdominal pain onset Thursday. Initially crampy feeling now more intense.  Associated with nausea and vomiting that he describes as light and dark green.  He went to an UC in [place] when it began and was told it was food poisoning, did not improve and went to [place] [facility] where they diagnosed him with SBO on x-ray.  He received 2 L IVF and zofran and feels a little bit better.  No BM x 4 days typically he goes daily.  Began passing gas today.  Abdominal pain is 8-9/10 mostly to periumbilical area and LLQ.  Still nauseous.  S/p cholecystectomy and stone in his "duct" that needed to be removed.    No fevers, chills, CP, SOB, problems with urination   HPI  Past Medical History:  Diagnosis Date  . Back pain, chronic   . CAD (coronary artery disease) 2009   nonobstructive by cath  . Chronic knee pain   . Chronic low back pain   . Dysrhythmia   . GERD (gastroesophageal reflux disease)   . History of PSVT (paroxysmal supraventricular tachycardia) 2009   nonsustained atach per Dr Marlou Porch on monitor in 2009. treated with diltiazem  . Hyperlipidemia    INTOLERANT OF STATINS  . Hypertension   . Knee pain, right    PARTICULARY AT NIGHT  . Near syncope 07/23/2014  . OSA (obstructive sleep apnea)    BIPAP  . Palpitations     Patient Active Problem List   Diagnosis Date Noted  . Radiculopathy 12/22/2014  . Near syncope 07/23/2014  . Chest pain at rest 07/23/2014  . Dizziness 07/22/2014  . Carotid bruit 07/22/2014  . Shortness of breath 07/22/2014  . Chest discomfort 07/22/2014  . SVT (supraventricular tachycardia) ([place]) 07/22/2014    Past Surgical History:    Procedure Laterality Date  . ANTERIOR CERVICAL DECOMPRESSION/DISCECTOMY FUSION 4 LEVELS N/A 12/22/2014   Procedure: ANTERIOR CERVICAL DECOMPRESSION/DISCECTOMY FUSION 4 LEVELS;  Surgeon: Phylliss Bob, MD;  Location: [facility];  Service: Orthopedics;  Laterality: N/A;  Anteriro cervical decompression fusion, cervical 3-4, cervical 4-5, cervical 5-6, cervical 6-7 with instrumentation and allograft  . CHOLECYSTECTOMY    . COLONOSCOPY     2003, 2008, 2013  . CORONARY ANGIOPLASTY  07/2007   NONOBSTRUCTIVE CORONARY PLAQUING- MINOR 20 % OSTIAL DIAGONAL STENOSIS, NORMAL LEFT VENTRICULAR EF 55%  . ERCP  2006  . KNEE ARTHROSCOPY Right 1982  . LAPAROSCOPIC CHOLECYSTECTOMY  2006  . VASECTOMY  1980        Home Medications    Prior to Admission medications   Medication Sig Start Date End Date Taking? Authorizing Provider  aspirin EC 81 MG tablet Take 81 mg by mouth daily.   Yes [provider]  diltiazem (DILACOR XR) 180 MG 24 hr capsule Take 180 mg by mouth 2 (two) times daily.    Yes [provider]  esomeprazole (NEXIUM) 40 MG capsule Take 40 mg by mouth daily.    Yes [provider]  Multiple Vitamins-Minerals (MULTIVITAMIN WITH MINERALS) tablet Take 1 tablet by mouth daily.   Yes [provider]  rosuvastatin (CRESTOR) 10 MG tablet Take  10 mg by mouth at bedtime.    Yes [provider]  ondansetron (ZOFRAN-ODT) 4 MG disintegrating tablet PUT 1 TABLET UNDER TOUNGE 3 TIMES A DAY AS NEEDED FOR NAUSEA 09/12/17   [provider]    Family History Family History  Problem Relation Age of Onset  . Heart attack Mother 63  . CVA Mother 67       AND AGAIN AT 26  . Colon cancer Father   . Pneumonia Father        WITH SEPSIS  . Heart attack Father   . Diabetes Maternal Grandmother   . Hyperlipidemia Sister   . Hypertension Sister   . Stroke Brother 9  . Hypertension Brother   . Dysrhythmia Brother        and DIZZINESS  . GER disease Sister      Social History Social History   Tobacco Use  . Smoking status: Former Smoker    Packs/day: 2.50    Types: Cigarettes    Last attempt to quit: 02/20/2000    Years since quitting: 17.5  Substance Use Topics  . Alcohol use: Yes    Comment: 3 beers a day  . Drug use: No     Allergies   Patient has no known allergies.   Review of Systems Review of Systems  Gastrointestinal: Positive for constipation, nausea and vomiting.  All other systems reviewed and are negative.    Physical Exam Updated Vital Signs BP 129/80 (BP Location: Right Arm)   Pulse 100   Temp 98.3 F (36.8 C) (Oral)   Resp 14   Wt 75.3 kg (166 lb)   SpO2 93%   BMI 23.48 kg/m   Physical Exam  Constitutional: He is oriented to person, place, and time. He appears well-developed and well-nourished. No distress.  Non toxic.  HENT:  Head: Normocephalic and atraumatic.  Nose: Nose normal.  Moist mucous membranes   Eyes: Pupils are equal, round, and reactive to light. Conjunctivae and EOM are normal.  Neck: Normal range of motion.  Cardiovascular: Normal rate, regular rhythm, normal heart sounds and intact distal pulses.  2+ DP and radial pulses bilaterally. No LE edema.   Pulmonary/Chest: Effort normal and breath sounds normal.  Abdominal: Soft. Bowel sounds are normal. There is tenderness. There is guarding.  Periumbilical and left mid/lower quadrants tenderness. No BS to lower quadrants.  No suprapubic or CVA tenderness. Negative Murphy's and McBurney's  Musculoskeletal: Normal range of motion.  Neurological: He is alert and oriented to person, place, and time.  Skin: Skin is warm and dry. Capillary refill takes less than 2 seconds.  Psychiatric: He has a normal mood and affect. His behavior is normal. Judgment and thought content normal.  Nursing note and vitals reviewed.    ED Treatments / Results  Labs (all labs ordered are listed, but only abnormal results are displayed) Labs Reviewed   COMPREHENSIVE METABOLIC PANEL - Abnormal; Notable for the following components:      Result Value   Chloride 89 (*)    Glucose, Bld 121 (*)    BUN 67 (*)    Creatinine, Ser 2.33 (*)    Total Bilirubin 1.6 (*)    GFR calc non Af Amer 27 (*)    GFR calc Af Amer 32 (*)    Anion gap 18 (*)    All other components within normal limits  CBC WITH DIFFERENTIAL/PLATELET - Abnormal; Notable for the following components:   Monocytes Absolute 1.1 (*)  All other components within normal limits  URINALYSIS, ROUTINE W REFLEX MICROSCOPIC - Abnormal; Notable for the following components:   Ketones, ur 5 (*)    All other components within normal limits  LIPASE, BLOOD  LACTIC ACID, PLASMA    EKG None  Radiology Ct Abdomen Pelvis Wo Contrast  Result Date: 09/15/2017 CLINICAL DATA:  Nausea and vomiting. Suspected small bowel obstruction. EXAM: CT ABDOMEN AND PELVIS WITHOUT CONTRAST TECHNIQUE: Multidetector CT imaging of the abdomen and pelvis was performed following the standard protocol without IV contrast. COMPARISON:  None. FINDINGS: Lower chest: The lung bases are clear of an acute process. No infiltrates, edema or effusions. No worrisome pulmonary lesions. The heart is normal in size. No pericardial effusion. Coronary artery calcifications are noted. The distal esophagus is grossly normal. Hepatobiliary: No focal hepatic lesions or intrahepatic biliary dilatation. The gallbladder is surgically absent. No common bile duct dilatation. Pancreas: No mass, inflammation or ductal dilatation. Spleen: Normal size.  No focal lesions. Adrenals/Urinary Tract: The adrenal glands and kidneys are unremarkable. No renal, ureteral or bladder calculi or mass. Stomach/Bowel: The stomach is distended and fluid-filled. The duodenum is also distended and fluid-filled. There is a moderate-sized duodenum diverticulum noted near the pancreatic head. Fairly markedly dilated small bowel with air-fluid levels throughout.  Maximum distention is up to 4.5 cm. There is a transition to normal/decompressed small bowel loops in the left mid abdomen best seen on image 49 of series 2 and image 43 of series 4. No masses identified. This is likely due to on adhesive process. The mid distal ileal loops of small bowel appear normal. The terminal ileum is normal. The appendix is normal. The colon is relatively decompressed. Moderate scattered diverticulosis without findings for acute diverticulitis. Vascular/Lymphatic: The aorta is normal in caliber. Moderate atheroscerlotic calcifications. No mesenteric of retroperitoneal mass or adenopathy. Small scattered lymph nodes are noted. Reproductive: The prostate gland and seminal vesicles are unremarkable. Other: No free air or free fluid. Musculoskeletal: No significant bony findings. IMPRESSION: 1. CT findings consistent with a small bowel obstruction with a transition point noted left mid abdomen at the mid ileal level. This is likely due to adhesions. No mass is identified. 2. No free air or free fluid. 3. Status post cholecystectomy.  No biliary dilatation. 4. Age advanced atherosclerotic calcifications involving the aorta and coronary arteries. Electronically Signed   By: Marijo Sanes M.D.   On: 09/15/2017 17:44    Procedures Procedures (including critical care time)  Medications Ordered in ED Medications  sodium chloride 0.9 % bolus 1,000 mL (1,000 mLs Intravenous New Bag/Given 09/15/17 1623)    And  0.9 %  sodium chloride infusion ( Intravenous New Bag/Given 09/15/17 1653)  morphine 4 MG/ML injection 4 mg (4 mg Intravenous Refused 09/15/17 1626)  diatrizoate meglumine-sodium (GASTROGRAFIN) 66-10 % solution 90 mL (has no administration in time range)  metoCLOPramide (REGLAN) injection 10 mg (10 mg Intravenous Given 09/15/17 1624)     Initial Impression / Assessment and Plan / ED Course  I have reviewed the triage vital signs and the nursing notes.  Pertinent labs & imaging  results that were available during my care of the patient were reviewed by me and considered in my medical decision making (see chart for details).  Clinical Course as of Sep 16 1907  Sun Sep 15, 2017  1755 IMPRESSION: 1. CT findings consistent with a small bowel obstruction with a transition point noted left mid abdomen at the mid ileal level. This is likely  due to adhesions. No mass is identified. 2. No free air or free fluid. 3. Status post cholecystectomy. No biliary dilatation. 4. Age advanced atherosclerotic calcifications involving the aorta and coronary arteries.  CT ABDOMEN PELVIS WO CONTRAST [CG]    Clinical Course User Index [CG] Kinnie Feil, PA-C   67 yo here with partial SBO on x-ray done by [facility] urgent care earlier today.  Other etiologies include complete SBO, perforated viscus, diverticulitis.   Creatinine 2.33, GFR 27, CT confirms SBO with transition point. No mass noted.  Advanced atherosclerosis noted.    1845: Spoke to triad hospitalist who will admit for SBO and acute kidney injury.  Pending general surgery consult.   1908: Spoke to Dr. Rosendo Gros with general surgery who requested SBO protocol be ordered.  No indication for NG tube currently patient is without nausea.  No emesis in the ER.  Protocol ordered.  Surgery will see as consult.  Final Clinical Impressions(s) / ED Diagnoses   Final diagnoses:  Small bowel obstruction ([place])  Acute kidney injury [facility])    ED Discharge Orders    None         Arlean Hopping 09/15/17 Kelli Churn, MD 09/15/17 2125

## 2017-09-16 ENCOUNTER — Encounter (HOSPITAL_COMMUNITY): Payer: Self-pay

## 2017-09-16 ENCOUNTER — Inpatient Hospital Stay (HOSPITAL_COMMUNITY): Payer: Medicare Other

## 2017-09-16 ENCOUNTER — Other Ambulatory Visit: Payer: Self-pay

## 2017-09-16 LAB — CBC
HCT: 39.3 % (ref 39.0–52.0)
Hemoglobin: 13.5 g/dL (ref 13.0–17.0)
MCH: 30.3 pg (ref 26.0–34.0)
MCHC: 34.4 g/dL (ref 30.0–36.0)
MCV: 88.1 fL (ref 78.0–100.0)
Platelets: 237 10*3/uL (ref 150–400)
RBC: 4.46 MIL/uL (ref 4.22–5.81)
RDW: 12.4 % (ref 11.5–15.5)
WBC: 6.5 10*3/uL (ref 4.0–10.5)

## 2017-09-16 LAB — GLUCOSE, CAPILLARY: Glucose-Capillary: 102 mg/dL — ABNORMAL HIGH (ref 70–99)

## 2017-09-16 LAB — BASIC METABOLIC PANEL
Anion gap: 8 (ref 5–15)
BUN: 49 mg/dL — ABNORMAL HIGH (ref 8–23)
CO2: 27 mmol/L (ref 22–32)
Calcium: 8.1 mg/dL — ABNORMAL LOW (ref 8.9–10.3)
Chloride: 99 mmol/L (ref 98–111)
Creatinine, Ser: 1.49 mg/dL — ABNORMAL HIGH (ref 0.61–1.24)
GFR calc Af Amer: 55 mL/min — ABNORMAL LOW (ref >60)
GFR calc non Af Amer: 47 mL/min — ABNORMAL LOW (ref >60)
Glucose, Bld: 96 mg/dL (ref 70–99)
Potassium: 3.6 mmol/L (ref 3.5–5.1)
Sodium: 134 mmol/L — ABNORMAL LOW (ref 135–145)

## 2017-09-16 LAB — HIV ANTIBODY (ROUTINE TESTING W REFLEX): HIV Screen 4th Generation wRfx: NONREACTIVE

## 2017-09-16 MED ORDER — ENSURE ENLIVE PO LIQD
237.0000 mL | Freq: Two times a day (BID) | ORAL | Status: DC
  Administered 2017-09-16 – 2017-09-18 (×4): 237 mL via ORAL

## 2017-09-16 NOTE — Progress Notes (Signed)
PROGRESS NOTE [facility]   Kamoni Gentles   TFT:[id number] DOB: 10-May-1950  DOA: 09/15/2017 PCP: Maury Dus, MD   Brief Narrative:  David [name] is a 67 year old male with medical history significant of PVC's, GERD, hyperlipidemia who presented to [facility] complaining of abdominal pain, nausea and vomiting.  He was referred to [facility], ED for further evaluation.  Upon ED evaluation CT of the abdomen showed SBO with transition point noted to left mid abdomen at the mid ileal level.  Creatinine elevated at 2.3 with BUN at 67.  Patient was admitted with working diagnosis of SBO complicated with acute renal injury.  General surgery was consulted and recommended conservative management.  Subjective: Patient seen and examined, he has been passing flatus and having liquid bowel movements.  Denies nausea, vomiting and abdominal pain.  No acute events overnight.  Assessment & Plan: SBO Likely from adhesion prior history of abdominal surgery. Appears to be resolving with contrast administration.  Patient clinically improving.  Diet has been advanced to clear liquids.  Tolerating well.  If continue without abdominal pain, nausea and vomiting will advance to full liquid diet for dinner tonight.  Continue IV hydration.  Encourage ambulation. General surgery recommendations appreciated.   AKI Prerenal from dehydration Creatinine improving with IV fluids, continue hydration. Avoid nephrotoxic agent and hypotension, check renal function in a.m.  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 if tolerating diet  Consultants:  Gen surgery   Procedures:   None   Antimicrobials:  None    Objective: Vitals:   09/15/17 1930 09/15/17 2217 09/16/17 0622 09/16/17 0637  BP: 130/86 115/85  130/78  Pulse: 97 88  79  Resp: 13 16  18   Temp:   98.7 F (37.1 C)  98.6 F (37 C)  TempSrc:  Oral  Oral  SpO2: 96% 95%  96%  Weight:   77.1 kg (169 lb 15.6 oz)   Height:   5\' 11"  (1.803 m)     Intake/Output Summary (Last 24 hours) at 09/16/2017 1401 Last data filed at 09/16/2017 1000 Gross per 24 hour  Intake 2680.4 ml  Output 200 ml  Net 2480.4 ml   Filed Weights   09/15/17 1525 09/16/17 0622  Weight: 75.3 kg (166 lb) 77.1 kg (169 lb 15.6 oz)    Examination:  General exam: Appears calm and comfortable  HEENT:  OP moist and clear Respiratory system: Clear to auscultation. No wheezes,crackle or rhonchi Cardiovascular system: S1 & S2 heard, RRR. No JVD, murmurs, rubs or gallops Gastrointestinal system: Abdomen is nondistended, soft and nontender. No organomegaly or masses felt. Central nervous system: Alert and oriented. No focal neurological deficits. Extremities: No pedal edema. Symmetric, strength 5/5   Skin: No rashes, lesions or ulcers Psychiatry:  Mood & affect appropriate.    Data Reviewed: I have personally reviewed following labs and imaging studies  CBC: Recent Labs  Lab 09/15/17 1628 09/16/17 0413  WBC 6.4 6.5  NEUTROABS 4.0  --   HGB 15.9 13.5  HCT 44.2 39.3  MCV 85.7 88.1  PLT 293 706   Basic Metabolic Panel: Recent Labs  Lab 09/15/17 1628 09/16/17 0413  NA 136 134*  K 3.8 3.6  CL 89* 99  CO2 29 27  GLUCOSE 121* 96  BUN 67* 49*  CREATININE 2.33* 1.49*  CALCIUM 9.3 8.1*   GFR: Estimated Creatinine Clearance: 51.9 mL/min (  A) (by C-G formula based on SCr of 1.49 mg/dL (H)). Liver Function Tests: Recent Labs  Lab 09/15/17 1628  AST 27  ALT 31  ALKPHOS 40  BILITOT 1.6*  PROT 7.7  ALBUMIN 4.1   Recent Labs  Lab 09/15/17 1628  LIPASE 48   No results for input(s): AMMONIA in the last 168 hours. Coagulation Profile: No results for input(s): INR, PROTIME in the last 168 hours. Cardiac Enzymes: No results for input(s): CKTOTAL, CKMB, CKMBINDEX, TROPONINI in the last 168 hours. BNP  (last 3 results) No results for input(s): PROBNP in the last 8760 hours. HbA1C: No results for input(s): HGBA1C in the last 72 hours. CBG: No results for input(s): GLUCAP in the last 168 hours. Lipid Profile: No results for input(s): CHOL, HDL, LDLCALC, TRIG, CHOLHDL, LDLDIRECT in the last 72 hours. Thyroid Function Tests: No results for input(s): TSH, T4TOTAL, FREET4, T3FREE, THYROIDAB in the last 72 hours. Anemia Panel: No results for input(s): VITAMINB12, FOLATE, FERRITIN, TIBC, IRON, RETICCTPCT in the last 72 hours. Sepsis Labs: Recent Labs  Lab 09/15/17 1629  LATICACIDVEN 1.1    No results found for this or any previous visit (from the past 240 hour(s)).    Radiology Studies: Ct Abdomen Pelvis Wo Contrast  Result Date: 09/15/2017 CLINICAL DATA:  Nausea and vomiting. Suspected small bowel obstruction. EXAM: CT ABDOMEN AND PELVIS WITHOUT CONTRAST TECHNIQUE: Multidetector CT imaging of the abdomen and pelvis was performed following the standard protocol without IV contrast. COMPARISON:  None. FINDINGS: Lower chest: The lung bases are clear of an acute process. No infiltrates, edema or effusions. No worrisome pulmonary lesions. The heart is normal in size. No pericardial effusion. Coronary artery calcifications are noted. The distal esophagus is grossly normal. Hepatobiliary: No focal hepatic lesions or intrahepatic biliary dilatation. The gallbladder is surgically absent. No common bile duct dilatation. Pancreas: No mass, inflammation or ductal dilatation. Spleen: Normal size.  No focal lesions. Adrenals/Urinary Tract: The adrenal glands and kidneys are unremarkable. No renal, ureteral or bladder calculi or mass. Stomach/Bowel: The stomach is distended and fluid-filled. The duodenum is also distended and fluid-filled. There is a moderate-sized duodenum diverticulum noted near the pancreatic head. Fairly markedly dilated small bowel with air-fluid levels throughout. Maximum distention is up  to 4.5 cm. There is a transition to normal/decompressed small bowel loops in the left mid abdomen best seen on image 49 of series 2 and image 43 of series 4. No masses identified. This is likely due to on adhesive process. The mid distal ileal loops of small bowel appear normal. The terminal ileum is normal. The appendix is normal. The colon is relatively decompressed. Moderate scattered diverticulosis without findings for acute diverticulitis. Vascular/Lymphatic: The aorta is normal in caliber. Moderate atheroscerlotic calcifications. No mesenteric of retroperitoneal mass or adenopathy. Small scattered lymph nodes are noted. Reproductive: The prostate gland and seminal vesicles are unremarkable. Other: No free air or free fluid. Musculoskeletal: No significant bony findings. IMPRESSION: 1. CT findings consistent with a small bowel obstruction with a transition point noted left mid abdomen at the mid ileal level. This is likely due to adhesions. No mass is identified. 2. No free air or free fluid. 3. Status post cholecystectomy.  No biliary dilatation. 4. Age advanced atherosclerotic calcifications involving the aorta and coronary arteries. Electronically Signed   By: Marijo Sanes M.D.   On: 09/15/2017 17:44   Dg Abd Portable 1v-small Bowel Obstruction Protocol-initial, 8 Hr Delay  Result Date: 09/16/2017 CLINICAL DATA:  67 year old male with  a history of small bowel obstruction EXAM: PORTABLE ABDOMEN - 1 VIEW COMPARISON:  CT 09/15/2017 FINDINGS: Enteric contrast is present throughout the length of the colon extending to the rectum, without abnormal distention. There are borderline dilated small bowel loops. No radiopaque foreign body. No unexpected soft tissue density. IMPRESSION: Enteric contrast has reached the rectum, with borderline dilated small bowel loops in the mid abdomen, compatible with partially resolved small bowel obstruction/intermittent bowel obstruction. Electronically Signed   By: Corrie Mckusick D.O.   On: 09/16/2017 07:23    Scheduled Meds: . diltiazem  180 mg Oral BID  . enoxaparin (LOVENOX) injection  30 mg Subcutaneous QHS  . feeding supplement (ENSURE ENLIVE)  237 mL Oral BID BM  .  morphine injection  4 mg Intravenous Once   Continuous Infusions: . sodium chloride 125 mL/hr at 09/16/17 0621  . famotidine (PEPCID) IV Stopped (09/16/17 1137)     LOS: 1 day    Time spent: Total of 25 minutes spent with pt, greater than 50% of which was spent in discussion of  treatment, counseling and coordination of care   Chipper Oman, MD Pager: Text Page via [email or web address]   If 7PM-7AM, please contact night-coverage [email or web address] 09/16/2017, 2:01 PM   Note - This record has been created using [employer]. Chart creation errors have been sought, but may not always have been located. Such creation errors do not reflect on the standard of medical care.

## 2017-09-16 NOTE — Consult Note (Signed)
Reason for Consult: bowel obstruction  Referring Physician: [name], [name] is an 67 y.o. male.  HPI: 67 yo male was in [place] over the weekend when 4 days ago began having nausea and diffuse abdominal pain. Pain was constant with some crescendos. He describes it as 8-10 in intensity. He went to urgent care and was diagnosed with gastroenteritis treated with antiemetics and sent home. 2 days ago he had a large vomiting episode with relief but the next day it worsened again so he went to the emergency room and was diagnosed with bowel obstruction. He has never had something like this before. Since the contrast he has been having diarrhea and his pain is resolved.  Past Medical History:  Diagnosis Date  . Back pain, chronic   . CAD (coronary artery disease) 2009   nonobstructive by cath  . Chronic knee pain   . Chronic low back pain   . Dysrhythmia   . GERD (gastroesophageal reflux disease)   . History of PSVT (paroxysmal supraventricular tachycardia) 2009   nonsustained atach per Dr Marlou Porch on monitor in 2009. treated with diltiazem  . Hyperlipidemia    INTOLERANT OF STATINS  . Hypertension   . Knee pain, right    PARTICULARY AT NIGHT  . Near syncope 07/23/2014  . OSA (obstructive sleep apnea)    BIPAP  . Palpitations     Past Surgical History:  Procedure Laterality Date  . ANTERIOR CERVICAL DECOMPRESSION/DISCECTOMY FUSION 4 LEVELS N/A 12/22/2014   Procedure: ANTERIOR CERVICAL DECOMPRESSION/DISCECTOMY FUSION 4 LEVELS;  Surgeon: David Bob, MD;  Location: [place];  Service: Orthopedics;  Laterality: N/A;  Anteriro cervical decompression fusion, cervical 3-4, cervical 4-5, cervical 5-6, cervical 6-7 with instrumentation and allograft  . CHOLECYSTECTOMY    . COLONOSCOPY     2003, 2008, 2013  . CORONARY ANGIOPLASTY  07/2007   NONOBSTRUCTIVE CORONARY PLAQUING- MINOR 20 % OSTIAL DIAGONAL STENOSIS, NORMAL LEFT VENTRICULAR EF 55%  . ERCP  2006  . KNEE ARTHROSCOPY Right 1982  .  LAPAROSCOPIC CHOLECYSTECTOMY  2006  . VASECTOMY  1980    Family History  Problem Relation Age of Onset  . Heart attack Mother 16  . CVA Mother 43       AND AGAIN AT 74  . Colon cancer Father   . Pneumonia Father        WITH SEPSIS  . Heart attack Father   . Diabetes Maternal Grandmother   . Hyperlipidemia Sister   . Hypertension Sister   . Stroke Brother 89  . Hypertension Brother   . Dysrhythmia Brother        and DIZZINESS  . GER disease Sister     Social History:  reports that he quit smoking about 17 years ago. His smoking use included cigarettes. He smoked 2.50 packs per day. He has never used smokeless tobacco. He reports that he drinks alcohol. He reports that he does not use drugs.  Allergies: No Known Allergies  Medications: I have reviewed the patient's current medications.  Results for orders placed or performed during the hospital encounter of 09/15/17 (from the past 48 hour(s))  Comprehensive metabolic panel     Status: Abnormal   Collection Time: 09/15/17  4:28 PM  Result Value Ref Range   Sodium 136 135 - 145 mmol/L   Potassium 3.8 3.5 - 5.1 mmol/L   Chloride 89 (L) 98 - 111 mmol/L   CO2 29 22 - 32 mmol/L   Glucose, Bld 121 (H)  70 - 99 mg/dL   BUN 67 (H) 8 - 23 mg/dL   Creatinine, Ser 2.33 (H) 0.61 - 1.24 mg/dL   Calcium 9.3 8.9 - 10.3 mg/dL   Total Protein 7.7 6.5 - 8.1 g/dL   Albumin 4.1 3.5 - 5.0 g/dL   AST 27 15 - 41 U/L   ALT 31 0 - 44 U/L   Alkaline Phosphatase 40 38 - 126 U/L   Total Bilirubin 1.6 (H) 0.3 - 1.2 mg/dL   GFR calc non Af Amer 27 (L) >60 mL/min   GFR calc Af Amer 32 (L) >60 mL/min    Comment: (NOTE) The eGFR has been calculated using the CKD EPI equation. This calculation has not been validated in all clinical situations. eGFR's persistently <60 mL/min signify possible Chronic Kidney Disease.    Anion gap 18 (H) 5 - 15    Comment: Performed at [facility], [place] [address] [place], [place] [address]   Lipase, blood     Status: None   Collection Time: 09/15/17  4:28 PM  Result Value Ref Range   Lipase 48 11 - 51 U/L    Comment: Performed at [facility], [place] [address], [place], [place] [address]  CBC with Diff     Status: Abnormal   Collection Time: 09/15/17  4:28 PM  Result Value Ref Range   WBC 6.4 4.0 - 10.5 K/uL   RBC 5.16 4.22 - 5.81 MIL/uL   Hemoglobin 15.9 13.0 - 17.0 g/dL   HCT 44.2 39.0 - 52.0 %   MCV 85.7 78.0 - 100.0 fL   MCH 30.8 26.0 - 34.0 pg   MCHC 36.0 30.0 - 36.0 g/dL   RDW 12.2 11.5 - 15.5 %   Platelets 293 150 - 400 K/uL   Neutrophils Relative % 62 %   Neutro Abs 4.0 1.7 - 7.7 K/uL   Lymphocytes Relative 20 %   Lymphs Abs 1.3 0.7 - 4.0 K/uL   Monocytes Relative 17 %   Monocytes Absolute 1.1 (H) 0.1 - 1.0 K/uL   Eosinophils Relative 1 %   Eosinophils Absolute 0.0 0.0 - 0.7 K/uL   Basophils Relative 0 %   Basophils Absolute 0.0 0.0 - 0.1 K/uL    Comment: Performed at [facility], [place] [address], [place], [place] [address]  Urinalysis, Routine w reflex microscopic     Status: Abnormal   Collection Time: 09/15/17  4:29 PM  Result Value Ref Range   Color, Urine YELLOW YELLOW   APPearance CLEAR CLEAR   Specific Gravity, Urine 1.020 1.005 - 1.030   pH 5.0 5.0 - 8.0   Glucose, UA NEGATIVE NEGATIVE mg/dL   Hgb urine dipstick NEGATIVE NEGATIVE   Bilirubin Urine NEGATIVE NEGATIVE   Ketones, ur 5 (A) NEGATIVE mg/dL   Protein, ur NEGATIVE NEGATIVE mg/dL   Nitrite NEGATIVE NEGATIVE   Leukocytes, UA NEGATIVE NEGATIVE    Comment: Performed at [place] [address], [place], [place] [address]  Lactic acid, plasma     Status: None   Collection Time: 09/15/17  4:29 PM  Result Value Ref Range   Lactic Acid, Venous 1.1 0.5 - 1.9 mmol/L    Comment: Performed at [facility], [place] [address] [place], [place] [address]  CBC     Status: None   Collection Time: 09/16/17  4:13 AM  Result  Value Ref Range   WBC 6.5 4.0 - 10.5 K/uL   RBC  4.46 4.22 - 5.81 MIL/uL   Hemoglobin 13.5 13.0 - 17.0 g/dL   HCT 39.3 39.0 - 52.0 %   MCV 88.1 78.0 - 100.0 fL   MCH 30.3 26.0 - 34.0 pg   MCHC 34.4 30.0 - 36.0 g/dL   RDW 12.4 11.5 - 15.5 %   Platelets 237 150 - 400 K/uL    Comment: Performed at [facility], [place] [address] [place], [place] [address]  Basic metabolic panel     Status: Abnormal   Collection Time: 09/16/17  4:13 AM  Result Value Ref Range   Sodium 134 (L) 135 - 145 mmol/L   Potassium 3.6 3.5 - 5.1 mmol/L   Chloride 99 98 - 111 mmol/L   CO2 27 22 - 32 mmol/L   Glucose, Bld 96 70 - 99 mg/dL   BUN 49 (H) 8 - 23 mg/dL   Creatinine, Ser 1.49 (H) 0.61 - 1.24 mg/dL   Calcium 8.1 (L) 8.9 - 10.3 mg/dL   GFR calc non Af Amer 47 (L) >60 mL/min   GFR calc Af Amer 55 (L) >60 mL/min    Comment: (NOTE) The eGFR has been calculated using the CKD EPI equation. This calculation has not been validated in all clinical situations. eGFR's persistently <60 mL/min signify possible Chronic Kidney Disease.    Anion gap 8 5 - 15    Comment: Performed at [facility], [place] [address] [place], [place] [address]    Ct Abdomen Pelvis Wo Contrast  Result Date: 09/15/2017 CLINICAL DATA:  Nausea and vomiting. Suspected small bowel obstruction. EXAM: CT ABDOMEN AND PELVIS WITHOUT CONTRAST TECHNIQUE: Multidetector CT imaging of the abdomen and pelvis was performed following the standard protocol without IV contrast. COMPARISON:  None. FINDINGS: Lower chest: The lung bases are clear of an acute process. No infiltrates, edema or effusions. No worrisome pulmonary lesions. The heart is normal in size. No pericardial effusion. Coronary artery calcifications are noted. The distal esophagus is grossly normal. Hepatobiliary: No focal hepatic lesions or intrahepatic biliary dilatation. The gallbladder is surgically absent. No common bile duct dilatation. Pancreas: No mass,  inflammation or ductal dilatation. Spleen: Normal size.  No focal lesions. Adrenals/Urinary Tract: The adrenal glands and kidneys are unremarkable. No renal, ureteral or bladder calculi or mass. Stomach/Bowel: The stomach is distended and fluid-filled. The duodenum is also distended and fluid-filled. There is a moderate-sized duodenum diverticulum noted near the pancreatic head. Fairly markedly dilated small bowel with air-fluid levels throughout. Maximum distention is up to 4.5 cm. There is a transition to normal/decompressed small bowel loops in the left mid abdomen best seen on image 49 of series 2 and image 43 of series 4. No masses identified. This is likely due to on adhesive process. The mid distal ileal loops of small bowel appear normal. The terminal ileum is normal. The appendix is normal. The colon is relatively decompressed. Moderate scattered diverticulosis without findings for acute diverticulitis. Vascular/Lymphatic: The aorta is normal in caliber. Moderate atheroscerlotic calcifications. No mesenteric of retroperitoneal mass or adenopathy. Small scattered lymph nodes are noted. Reproductive: The prostate gland and seminal vesicles are unremarkable. Other: No free air or free fluid. Musculoskeletal: No significant bony findings. IMPRESSION: 1. CT findings consistent with a small bowel obstruction with a transition point noted left mid abdomen at the mid ileal level. This is likely due to adhesions. No mass is identified. 2. No free air or free fluid. 3. Status post cholecystectomy.  No biliary dilatation. 4. Age  advanced atherosclerotic calcifications involving the aorta and coronary arteries. Electronically Signed   By: Marijo Sanes M.D.   On: 09/15/2017 17:44   Dg Abd Portable 1v-small Bowel Obstruction Protocol-initial, 8 Hr Delay  Result Date: 09/16/2017 CLINICAL DATA:  67 year old male with a history of small bowel obstruction EXAM: PORTABLE ABDOMEN - 1 VIEW COMPARISON:  CT 09/15/2017  FINDINGS: Enteric contrast is present throughout the length of the colon extending to the rectum, without abnormal distention. There are borderline dilated small bowel loops. No radiopaque foreign body. No unexpected soft tissue density. IMPRESSION: Enteric contrast has reached the rectum, with borderline dilated small bowel loops in the mid abdomen, compatible with partially resolved small bowel obstruction/intermittent bowel obstruction. Electronically Signed   By: Corrie Mckusick D.O.   On: 09/16/2017 07:23    Review of Systems  Constitutional: Negative for chills and fever.  HENT: Negative for hearing loss.   Eyes: Negative for blurred vision and double vision.  Respiratory: Negative for cough and hemoptysis.   Cardiovascular: Negative for chest pain and palpitations.  Gastrointestinal: Positive for abdominal pain and nausea. Negative for vomiting.  Genitourinary: Negative for dysuria and urgency.  Musculoskeletal: Negative for myalgias and neck pain.  Skin: Negative for itching and rash.  Neurological: Negative for dizziness, tingling and headaches.  Endo/Heme/Allergies: Does not bruise/bleed easily.  Psychiatric/Behavioral: Negative for depression and suicidal ideas.   Blood pressure 130/78, pulse 79, temperature 98.6 F (37 C), temperature source Oral, resp. rate 18, height 5' 11"  (1.803 m), weight 77.1 kg (169 lb 15.6 oz), SpO2 96 %. Physical Exam  Vitals reviewed. Constitutional: He is oriented to person, place, and time. He appears well-developed and well-nourished.  HENT:  Head: Normocephalic and atraumatic.  Eyes: Pupils are equal, round, and reactive to light. Conjunctivae and EOM are normal.  Neck: Normal range of motion. Neck supple.  Cardiovascular: Normal rate and regular rhythm.  Respiratory: Effort normal and breath sounds normal.  GI: Soft. Bowel sounds are normal. He exhibits no distension. There is no tenderness.  Musculoskeletal: Normal range of motion.   Neurological: He is alert and oriented to person, place, and time.  Skin: Skin is warm and dry.  Psychiatric: He has a normal mood and affect. His behavior is normal.    Assessment/Plan: 67 yo male with SBO that appears to have resolved with contrast administration. History of cholecystectomy for choledocholithiasis 15 years ago. -no need for NG tube -liquids today -will continue to follow  David [name] 09/16/2017, 7:59 AM

## 2017-09-17 DIAGNOSIS — N179 Acute kidney failure, unspecified: Secondary | ICD-10-CM

## 2017-09-17 DIAGNOSIS — K56609 Unspecified intestinal obstruction, unspecified as to partial versus complete obstruction: Secondary | ICD-10-CM

## 2017-09-17 LAB — BASIC METABOLIC PANEL
Anion gap: 6 (ref 5–15)
BUN: 19 mg/dL (ref 8–23)
CO2: 30 mmol/L (ref 22–32)
Calcium: 8.6 mg/dL — ABNORMAL LOW (ref 8.9–10.3)
Chloride: 102 mmol/L (ref 98–111)
Creatinine, Ser: 0.94 mg/dL (ref 0.61–1.24)
GFR calc Af Amer: >60 mL/min (ref >60)
GFR calc non Af Amer: >60 mL/min (ref >60)
Glucose, Bld: 99 mg/dL (ref 70–99)
Potassium: 3.7 mmol/L (ref 3.5–5.1)
Sodium: 138 mmol/L (ref 135–145)

## 2017-09-17 LAB — MAGNESIUM: Magnesium: 2.4 mg/dL (ref 1.7–2.4)

## 2017-09-17 MED ORDER — ALUM & MAG HYDROXIDE-SIMETH 200-200-20 MG/5ML PO SUSP
30.0000 mL | Freq: Four times a day (QID) | ORAL | Status: DC | PRN
  Administered 2017-09-17 – 2017-09-18 (×2): 30 mL via ORAL
  Filled 2017-09-17 (×2): qty 30

## 2017-09-17 MED ORDER — ENOXAPARIN SODIUM 40 MG/0.4ML ~~LOC~~ SOLN
40.0000 mg | Freq: Every day | SUBCUTANEOUS | Status: DC
  Administered 2017-09-17: 40 mg via SUBCUTANEOUS
  Filled 2017-09-17: qty 0.4

## 2017-09-17 NOTE — Progress Notes (Signed)
    CC:  SBO  Subjective: Pt is doing better, had a lot of nausea last PM, frequent diarrhea up to 6 times yesterday.  He has been up walking.  Got some pudding and yogurt down last PM.  Medicine has increased him to a soft diet.  Abdomen is soft and not distended this AM.   Objective: Vital signs in last 24 hours: Temp:  [98.5 F (36.9 C)-99.8 F (37.7 C)] 98.5 F (36.9 C) (07/30 0605) Pulse Rate:  [63-87] 63 (07/30 0605) Resp:  [15-16] 15 (07/30 0605) BP: (120-130)/(74-75) 120/74 (07/30 0605) SpO2:  [94 %-97 %] 94 % (07/30 0605) Last BM Date: 09/16/17 1320 p.o. 2600 IV 2100 urine BM x1 Afebrile vital signs are stable BMP is normal creatinine is down to 0.94 Portable film 7/29: Gery contrast reached the rectum with borderline dilated small bowel loops in the mid abdomen compatible with partially dull small bowel obstruction. Intake/Output from previous day: 07/29 0701 - 07/30 0700 In: 3920 [P.O.:1320; I.V.:2500; IV Piggyback:100] Out: 2100 [Urine:2100] Intake/Output this shift: No intake/output data recorded.  General appearance: alert, cooperative and no distress GI: soft, not really distended much this AM.  Having diarrhea and some nausea last PM.    Lab Results:  Recent Labs    09/15/17 1628 09/16/17 0413  WBC 6.4 6.5  HGB 15.9 13.5  HCT 44.2 39.3  PLT 293 237    BMET Recent Labs    09/16/17 0413 09/17/17 0413  NA 134* 138  K 3.6 3.7  CL 99 102  CO2 27 30  GLUCOSE 96 99  BUN 49* 19  CREATININE 1.49* 0.94  CALCIUM 8.1* 8.6*   PT/INR No results for input(s): LABPROT, INR in the last 72 hours.  Recent Labs  Lab 09/15/17 1628  AST 27  ALT 31  ALKPHOS 40  BILITOT 1.6*  PROT 7.7  ALBUMIN 4.1     Lipase     Component Value Date/Time   LIPASE 48 09/15/2017 1628     Medications: . diltiazem  180 mg Oral BID  . enoxaparin (LOVENOX) injection  30 mg Subcutaneous QHS  . feeding supplement (ENSURE ENLIVE)  237 mL Oral BID BM  .  morphine  injection  4 mg Intravenous Once   . famotidine (PEPCID) IV Stopped (09/16/17 2147)   Assessment/Plan CAD/history of PSVT GERD Chronic back and knee pain OSA -BiPAP  SBO/history of cholecystectomy Acute kidney injury -creatinine down to 0.94  FEN: Soft diet started this a.m. full liquids yesterday ID: None DVT: Lovenox   Plan:  He is improving slowly, he is transitioning to a soft diet.  I would be sure he tolerates the diet and diarrhea is better.    LOS: 2 days    Priyah Schmuck 09/17/2017 [id number]

## 2017-09-17 NOTE — Progress Notes (Signed)
PROGRESS NOTE [facility]   [place] Genevive Bi.   URK:[id number] DOB: 1950/05/16  DOA: 09/15/2017 PCP: Maury Dus, MD   Brief Narrative:  Mantaj Chamberlin. is a 67 year old male with medical history significant of PVC's, GERD, hyperlipidemia who presented to [facility] complaining of abdominal pain, nausea and vomiting.  He was referred to [facility] ED for further evaluation.  Upon ED evaluation CT of the abdomen showed SBO with transition point noted to left mid abdomen at the mid ileal level.  Creatinine elevated at 2.3 with BUN at 67.  Patient was admitted with working diagnosis of SBO complicated with acute renal injury.  General surgery was consulted and recommended conservative management.  Subjective: Patient seen and examined, he have nausea all night. Mild abdominal pain after eating. Having many episodes of diarrhea.   Assessment & Plan: SBO - clinically resolved  Likely from adhesion prior history of abdominal surgery. Patient is clinically improving however continues with some, nausea and diarrhea, surgery recommending to observe overnight, if diarrhea and nausea improved will d/c in AM. Per surgery ok with soft diet. Continue to ambulate. Supportive treatment   AKI - resolved  Prerenal from dehydration Improved after IVF, encourage oral hydration  Avoid nephrotoxic agent and hypotension, check renal function in a.m.  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 if tolerating diet  Consultants:  Gen surgery   Procedures:   None   Antimicrobials:  None    Objective: Vitals:   09/16/17 0637 09/16/17 1401 09/16/17 2008 09/17/17 0605  BP: 130/78 130/74 125/75 120/74  Pulse: 79 87 70 63  Resp: 18 16 16 15   Temp: 98.6 F (37 C) 99.1 F (37.3 C) 99.8 F (37.7 C) 98.5 F (36.9 C)  TempSrc: Oral Oral Oral  Oral  SpO2: 96% 94% 97% 94%  Weight:      Height:        Intake/Output Summary (Last 24 hours) at 09/17/2017 1245 Last data filed at 09/17/2017 1025 Gross per 24 hour  Intake 3540 ml  Output 2300 ml  Net 1240 ml   Filed Weights   09/15/17 1525 09/16/17 0622  Weight: 75.3 kg (166 lb) 77.1 kg (169 lb 15.6 oz)    Examination:  General: Pt is alert, awake, not in acute distress Cardiovascular: RRR, S1/S2 +, no rubs, no gallops Respiratory: CTA bilaterally, no wheezing, no rhonchi Abdominal: Soft, NT, ND, bowel sounds + Extremities: no edema, no cyanosis  Data Reviewed: I have personally reviewed following labs and imaging studies  CBC: Recent Labs  Lab 09/15/17 1628 09/16/17 0413  WBC 6.4 6.5  NEUTROABS 4.0  --   HGB 15.9 13.5  HCT 44.2 39.3  MCV 85.7 88.1  PLT 293 831   Basic Metabolic Panel: Recent Labs  Lab 09/15/17 1628 09/16/17 0413 09/17/17 0413  NA 136 134* 138  K 3.8 3.6 3.7  CL 89* 99 102  CO2 29 27 30   GLUCOSE 121* 96 99  BUN 67* 49* 19  CREATININE 2.33* 1.49* 0.94  CALCIUM 9.3 8.1* 8.6*  MG  --   --  2.4   GFR: Estimated Creatinine Clearance: 82.3 mL/min (by C-G formula based on SCr of 0.94 mg/dL). Liver Function Tests: Recent Labs  Lab 09/15/17 1628  AST 27  ALT 31  ALKPHOS 40  BILITOT 1.6*  PROT 7.7  ALBUMIN 4.1  Recent Labs  Lab 09/15/17 1628  LIPASE 48   No results for input(s): AMMONIA in the last 168 hours. Coagulation Profile: No results for input(s): INR, PROTIME in the last 168 hours. Cardiac Enzymes: No results for input(s): CKTOTAL, CKMB, CKMBINDEX, TROPONINI in the last 168 hours. BNP (last 3 results) No results for input(s): PROBNP in the last 8760 hours. HbA1C: No results for input(s): HGBA1C in the last 72 hours. CBG: Recent Labs  Lab 09/16/17 1942  GLUCAP 102*   Lipid Profile: No results for input(s): CHOL, HDL, LDLCALC, TRIG, CHOLHDL, LDLDIRECT in the last 72 hours. Thyroid Function Tests: No results  for input(s): TSH, T4TOTAL, FREET4, T3FREE, THYROIDAB in the last 72 hours. Anemia Panel: No results for input(s): VITAMINB12, FOLATE, FERRITIN, TIBC, IRON, RETICCTPCT in the last 72 hours. Sepsis Labs: Recent Labs  Lab 09/15/17 1629  LATICACIDVEN 1.1    No results found for this or any previous visit (from the past 240 hour(s)).    Radiology Studies: Ct Abdomen Pelvis Wo Contrast  Result Date: 09/15/2017 CLINICAL DATA:  Nausea and vomiting. Suspected small bowel obstruction. EXAM: CT ABDOMEN AND PELVIS WITHOUT CONTRAST TECHNIQUE: Multidetector CT imaging of the abdomen and pelvis was performed following the standard protocol without IV contrast. COMPARISON:  None. FINDINGS: Lower chest: The lung bases are clear of an acute process. No infiltrates, edema or effusions. No worrisome pulmonary lesions. The heart is normal in size. No pericardial effusion. Coronary artery calcifications are noted. The distal esophagus is grossly normal. Hepatobiliary: No focal hepatic lesions or intrahepatic biliary dilatation. The gallbladder is surgically absent. No common bile duct dilatation. Pancreas: No mass, inflammation or ductal dilatation. Spleen: Normal size.  No focal lesions. Adrenals/Urinary Tract: The adrenal glands and kidneys are unremarkable. No renal, ureteral or bladder calculi or mass. Stomach/Bowel: The stomach is distended and fluid-filled. The duodenum is also distended and fluid-filled. There is a moderate-sized duodenum diverticulum noted near the pancreatic head. Fairly markedly dilated small bowel with air-fluid levels throughout. Maximum distention is up to 4.5 cm. There is a transition to normal/decompressed small bowel loops in the left mid abdomen best seen on image 49 of series 2 and image 43 of series 4. No masses identified. This is likely due to on adhesive process. The mid distal ileal loops of small bowel appear normal. The terminal ileum is normal. The appendix is normal. The colon  is relatively decompressed. Moderate scattered diverticulosis without findings for acute diverticulitis. Vascular/Lymphatic: The aorta is normal in caliber. Moderate atheroscerlotic calcifications. No mesenteric of retroperitoneal mass or adenopathy. Small scattered lymph nodes are noted. Reproductive: The prostate gland and seminal vesicles are unremarkable. Other: No free air or free fluid. Musculoskeletal: No significant bony findings. IMPRESSION: 1. CT findings consistent with a small bowel obstruction with a transition point noted left mid abdomen at the mid ileal level. This is likely due to adhesions. No mass is identified. 2. No free air or free fluid. 3. Status post cholecystectomy.  No biliary dilatation. 4. Age advanced atherosclerotic calcifications involving the aorta and coronary arteries. Electronically Signed   By: Marijo Sanes M.D.   On: 09/15/2017 17:44   Dg Abd Portable 1v-small Bowel Obstruction Protocol-initial, 8 Hr Delay  Result Date: 09/16/2017 CLINICAL DATA:  67 year old male with a history of small bowel obstruction EXAM: PORTABLE ABDOMEN - 1 VIEW COMPARISON:  CT 09/15/2017 FINDINGS: Enteric contrast is present throughout the length of the colon extending to the rectum, without abnormal distention. There are borderline dilated small  bowel loops. No radiopaque foreign body. No unexpected soft tissue density. IMPRESSION: Enteric contrast has reached the rectum, with borderline dilated small bowel loops in the mid abdomen, compatible with partially resolved small bowel obstruction/intermittent bowel obstruction. Electronically Signed   By: Corrie Mckusick D.O.   On: 09/16/2017 07:23    Scheduled Meds: . diltiazem  180 mg Oral BID  . enoxaparin (LOVENOX) injection  40 mg Subcutaneous QHS  . feeding supplement (ENSURE ENLIVE)  237 mL Oral BID BM  .  morphine injection  4 mg Intravenous Once   Continuous Infusions: . famotidine (PEPCID) IV 20 mg (09/17/17 1002)     LOS: 2 days    Time spent: Total of 15 minutes spent with pt, greater than 50% of which was spent in discussion of  treatment, counseling and coordination of care   Chipper Oman, MD Pager: Text Page via [email or web address]   If 7PM-7AM, please contact night-coverage [email or web address] 09/17/2017, 12:45 PM   Note - This record has been created using [employer]. Chart creation errors have been sought, but may not always have been located. Such creation errors do not reflect on the standard of medical care.

## 2017-09-17 NOTE — Progress Notes (Signed)
Nutrition Brief Note  Patient identified on the Malnutrition Screening Tool (MST) Report  Patient in room eating breakfast and states his appetite is good. Other than the N/V he experienced PTA he states he eats well and tries to eat healthy. UBW is ~170-175 lb.  Has been ordered Ensure supplements.  Wt Readings from Last 15 Encounters:  09/16/17 169 lb 15.6 oz (77.1 kg)  12/22/14 190 lb 4.8 oz (86.3 kg)  12/20/14 190 lb 4.8 oz (86.3 kg)  09/20/14 187 lb 6.4 oz (85 kg)  07/24/14 190 lb 12.8 oz (86.5 kg)  07/22/14 191 lb 12.8 oz (87 kg)   *States he lost weight in 2016 following a neck surgery.   Body mass index is 23.71 kg/m. Patient meets criteria for normal based on current BMI.   Current diet order is soft, still eating his breakfast tray during visit. Labs and medications reviewed.   No nutrition interventions warranted at this time. If nutrition issues arise, please consult RD.   Clayton Bibles, MS, RD, [facility] Pager: [phone] After Hours Pager: [phone]

## 2017-09-18 LAB — BASIC METABOLIC PANEL
Anion gap: 7 (ref 5–15)
BUN: 10 mg/dL (ref 8–23)
CO2: 27 mmol/L (ref 22–32)
Calcium: 8.6 mg/dL — ABNORMAL LOW (ref 8.9–10.3)
Chloride: 103 mmol/L (ref 98–111)
Creatinine, Ser: 0.84 mg/dL (ref 0.61–1.24)
GFR calc Af Amer: >60 mL/min (ref >60)
GFR calc non Af Amer: >60 mL/min (ref >60)
Glucose, Bld: 103 mg/dL — ABNORMAL HIGH (ref 70–99)
Potassium: 3.6 mmol/L (ref 3.5–5.1)
Sodium: 137 mmol/L (ref 135–145)

## 2017-09-18 LAB — CBC
HCT: 36.5 % — ABNORMAL LOW (ref 39.0–52.0)
Hemoglobin: 12.4 g/dL — ABNORMAL LOW (ref 13.0–17.0)
MCH: 30.4 pg (ref 26.0–34.0)
MCHC: 34 g/dL (ref 30.0–36.0)
MCV: 89.5 fL (ref 78.0–100.0)
Platelets: 229 10*3/uL (ref 150–400)
RBC: 4.08 MIL/uL — ABNORMAL LOW (ref 4.22–5.81)
RDW: 12.2 % (ref 11.5–15.5)
WBC: 7.7 10*3/uL (ref 4.0–10.5)

## 2017-09-18 LAB — MAGNESIUM: Magnesium: 2.4 mg/dL (ref 1.7–2.4)

## 2017-09-18 MED ORDER — ENSURE ENLIVE PO LIQD: 237.0000 mL | Freq: Two times a day (BID) | ORAL | 12 refills | Status: DC

## 2017-09-18 NOTE — Discharge Summary (Signed)
Physician Discharge Summary  David [name]. TFT:[id number] DOB: [birth year] DOA: 66/28/2019  PCP: Maury Dus, MD  Admit date: 09/15/2017 Discharge date: 09/18/2017  Admitted From: Home Disposition:  Home  Recommendations for Outpatient Follow-up:  1. Follow up with PCP in 1-2 weeks 2. Follow up with General Surgery within 1-2 weeks 3. Follow up with Gastroenterology as an outpatient if necessary 4. Follow up with Cardiology Dr. Marlou Porch as an outpatient for Hx of PSVT 5. Please obtain CMP/CBC, Mag, Phos in one week 6. Please follow up on the following pending results:  Home Health: No Equipment/Devices: None  Discharge Condition: Stable CODE STATUS: FULL CODE Diet recommendation: Soft Diet   Brief/Interim Summary: David [name]. is a 67 year old male with medical history significant of PVC's, GERD, hyperlipidemia and other who presented to [facility] complaining of abdominal pain, nausea and vomiting.  He was [facility] for further evaluation.  Upon ED evaluation CT of the abdomen showed SBO with transition point noted to left mid abdomen at the mid ileal level.  Creatinine elevated at 2.3 with BUN at 67.  Patient was admitted with working diagnosis of SBO complicated with acute renal injury. General surgery was consulted and recommended conservative management.  He steadily improved and was deemed medically stable for discharge at this time.  He will need to follow-up with his primary care physician, Gastroenterology, as well as General Surgery in the outpatient setting.  Discharge Diagnoses:  Active Problems:   SBO (small bowel obstruction) (HCC)   AKI (acute kidney injury) ([place])  SBO - clinically resolved  -Likely from adhesion prior history of abdominal surgery of cholecystectomy. -Patient is clinically improved however continued with some, nausea and diarrhea yesterday,  -Surgery recommend to observe overnight, if diarrhea and nausea  improved can D/C Home. Surgery Evaluated and ok with D/C Home  -Per surgery ok with soft diet.  -Continue to ambulate.  -C/w Supportive treatment and Antiemetics with IV Zofran q6h PRN -Follow up with PCP, GI, and with General Surgery in the outpatient setting.    AKI - resolved  -Prerenal from dehydration -Improved after IVF, encourage oral hydration  -Avoid nephrotoxic agent and hypotension, check renal function in a.m. -BUN/Cr now stable at 10/0.94  PVC's/Dysrythmia/Hx of PSVT -On Diltiazem, continue with current dosing at 180 mg po BID  GERD -Continued Pepcid IV 20 mg q12h and change back to po Esomeprazole 40 mg at D/C  CAD -Non-obstructive by Cath -C/w ASA 81 mg po daily and Rosuvastatin 10 mg po qHS -Follow up with Cardiology Dr. Marlou Porch as an outpatient   HLD -C/w Rosuvastatin 10 mg po qHS  OSA -C/w BiPAP at Home   Discharge Instructions  Discharge Instructions    Call MD for:  difficulty breathing, headache or visual disturbances   Complete by:  As directed    Call MD for:  extreme fatigue   Complete by:  As directed    Call MD for:  hives   Complete by:  As directed    Call MD for:  persistant dizziness or light-headedness   Complete by:  As directed    Call MD for:  persistant nausea and vomiting   Complete by:  As directed    Call MD for:  redness, tenderness, or signs of infection (pain, swelling, redness, odor or green/yellow discharge around incision site)   Complete by:  As directed    Call MD for:  severe uncontrolled pain   Complete by:  As directed    Call MD for:  temperature >100.4   Complete by:  As directed    Diet - low sodium heart healthy   Complete by:  As directed    SOFT DIET   Discharge instructions   Complete by:  As directed    Follow-up with PCP, general surgery, and gastroenterology in the outpatient setting.  Take all medication as prescribed.  If symptoms change or worsen please return to emergency room for evaluation.    Increase activity slowly   Complete by:  As directed      Allergies as of 09/18/2017   No Known Allergies     Medication List    TAKE these medications   aspirin EC 81 MG tablet Take 81 mg by mouth daily.   diltiazem 180 MG 24 hr capsule Commonly known as:  DILACOR XR Take 180 mg by mouth 2 (two) times daily.   esomeprazole 40 MG capsule Commonly known as:  NEXIUM Take 40 mg by mouth daily.   feeding supplement (ENSURE ENLIVE) Liqd Take 237 mLs by mouth 2 (two) times daily between meals.   multivitamin with minerals tablet Take 1 tablet by mouth daily.   ondansetron 4 MG disintegrating tablet Commonly known as:  ZOFRAN-ODT PUT 1 TABLET UNDER TOUNGE 3 TIMES A DAY AS NEEDED FOR NAUSEA   rosuvastatin 10 MG tablet Commonly known as:  CRESTOR Take 10 mg by mouth at bedtime.       No Known Allergies  Consultations:  General Surgery  Procedures/Studies: Ct Abdomen Pelvis Wo Contrast  Result Date: 09/15/2017 CLINICAL DATA:  Nausea and vomiting. Suspected small bowel obstruction. EXAM: CT ABDOMEN AND PELVIS WITHOUT CONTRAST TECHNIQUE: Multidetector CT imaging of the abdomen and pelvis was performed following the standard protocol without IV contrast. COMPARISON:  None. FINDINGS: Lower chest: The lung bases are clear of an acute process. No infiltrates, edema or effusions. No worrisome pulmonary lesions. The heart is normal in size. No pericardial effusion. Coronary artery calcifications are noted. The distal esophagus is grossly normal. Hepatobiliary: No focal hepatic lesions or intrahepatic biliary dilatation. The gallbladder is surgically absent. No common bile duct dilatation. Pancreas: No mass, inflammation or ductal dilatation. Spleen: Normal size.  No focal lesions. Adrenals/Urinary Tract: The adrenal glands and kidneys are unremarkable. No renal, ureteral or bladder calculi or mass. Stomach/Bowel: The stomach is distended and fluid-filled. The duodenum is also distended  and fluid-filled. There is a moderate-sized duodenum diverticulum noted near the pancreatic head. Fairly markedly dilated small bowel with air-fluid levels throughout. Maximum distention is up to 4.5 cm. There is a transition to normal/decompressed small bowel loops in the left mid abdomen best seen on image 49 of series 2 and image 43 of series 4. No masses identified. This is likely due to on adhesive process. The mid distal ileal loops of small bowel appear normal. The terminal ileum is normal. The appendix is normal. The colon is relatively decompressed. Moderate scattered diverticulosis without findings for acute diverticulitis. Vascular/Lymphatic: The aorta is normal in caliber. Moderate atheroscerlotic calcifications. No mesenteric of retroperitoneal mass or adenopathy. Small scattered lymph nodes are noted. Reproductive: The prostate gland and seminal vesicles are unremarkable. Other: No free air or free fluid. Musculoskeletal: No significant bony findings. IMPRESSION: 1. CT findings consistent with a small bowel obstruction with a transition point noted left mid abdomen at the mid ileal level. This is likely due to adhesions. No mass is identified. 2. No free air or free fluid. 3.  Status post cholecystectomy.  No biliary dilatation. 4. Age advanced atherosclerotic calcifications involving the aorta and coronary arteries. Electronically Signed   By: Marijo Sanes M.D.   On: 09/15/2017 17:44   Dg Abd Portable 1v-small Bowel Obstruction Protocol-initial, 8 Hr Delay  Result Date: 09/16/2017 CLINICAL DATA:  67 year old male with a history of small bowel obstruction EXAM: PORTABLE ABDOMEN - 1 VIEW COMPARISON:  CT 09/15/2017 FINDINGS: Enteric contrast is present throughout the length of the colon extending to the rectum, without abnormal distention. There are borderline dilated small bowel loops. No radiopaque foreign body. No unexpected soft tissue density. IMPRESSION: Enteric contrast has reached the rectum,  with borderline dilated small bowel loops in the mid abdomen, compatible with partially resolved small bowel obstruction/intermittent bowel obstruction. Electronically Signed   By: Corrie Mckusick D.O.   On: 09/16/2017 07:23    Subjective: Seen and examined at bedside and was doing well.  Denied any chest pain, shortness breath, lightheadedness or dizziness.  Did have some mild abdominal tenderness but felt much improved.  States he is having some mild diarrhea but it is not bothersome.  No other concerns or complaints at this time and is ready to go home.  Discharge Exam: Vitals:   09/18/17 0514 09/18/17 1054  BP: 110/63 121/71  Pulse: 60 67  Resp: 16   Temp: 98.8 F (37.1 C)   SpO2: 94% 96%   Vitals:   09/17/17 1958 09/17/17 2132 09/18/17 0514 09/18/17 1054  BP:  136/83 110/63 121/71  Pulse: 62 71 60 67  Resp: 17 18 16    Temp:  98.5 F (36.9 C) 98.8 F (37.1 C)   TempSrc:  Oral Oral   SpO2: 97% 97% 94% 96%  Weight:      Height:       General: Pt is alert, awake, not in acute distress Cardiovascular: RRR, S1/S2 +, no rubs, no gallops Respiratory: Diminished bilaterally, no wheezing, no rhonchi Abdominal: Soft, Mildly tender and uncomfortable to palpate, ND, bowel sounds + Extremities: no edema, no cyanosis  The results of significant diagnostics from this hospitalization (including imaging, microbiology, ancillary and laboratory) are listed below for reference.    Microbiology: No results found for this or any previous visit (from the past 240 hour(s)).   Labs: BNP (last 3 results) No results for input(s): BNP in the last 8760 hours. Basic Metabolic Panel: Recent Labs  Lab 09/15/17 1628 09/16/17 0413 09/17/17 0413 09/18/17 0412  NA 136 134* 138 137  K 3.8 3.6 3.7 3.6  CL 89* 99 102 103  CO2 29 27 30 27   GLUCOSE 121* 96 99 103*  BUN 67* 49* 19 10  CREATININE 2.33* 1.49* 0.94 0.84  CALCIUM 9.3 8.1* 8.6* 8.6*  MG  --   --  2.4 2.4   Liver Function Tests: Recent  Labs  Lab 09/15/17 1628  AST 27  ALT 31  ALKPHOS 40  BILITOT 1.6*  PROT 7.7  ALBUMIN 4.1   Recent Labs  Lab 09/15/17 1628  LIPASE 48   No results for input(s): AMMONIA in the last 168 hours. CBC: Recent Labs  Lab 09/15/17 1628 09/16/17 0413 09/18/17 0412  WBC 6.4 6.5 7.7  NEUTROABS 4.0  --   --   HGB 15.9 13.5 12.4*  HCT 44.2 39.3 36.5*  MCV 85.7 88.1 89.5  PLT 293 237 229   Cardiac Enzymes: No results for input(s): CKTOTAL, CKMB, CKMBINDEX, TROPONINI in the last 168 hours. BNP: Invalid input(s): POCBNP CBG: Recent Labs  Lab 09/16/17 1942  GLUCAP 102*   D-Dimer No results for input(s): DDIMER in the last 72 hours. Hgb A1c No results for input(s): HGBA1C in the last 72 hours. Lipid Profile No results for input(s): CHOL, HDL, LDLCALC, TRIG, CHOLHDL, LDLDIRECT in the last 72 hours. Thyroid function studies No results for input(s): TSH, T4TOTAL, T3FREE, THYROIDAB in the last 72 hours.  Invalid input(s): FREET3 Anemia work up No results for input(s): VITAMINB12, FOLATE, FERRITIN, TIBC, IRON, RETICCTPCT in the last 72 hours. Urinalysis    Component Value Date/Time   COLORURINE YELLOW 09/15/2017 [place] 09/15/2017 1629   LABSPEC 1.020 09/15/2017 1629   PHURINE 5.0 09/15/2017 1629   GLUCOSEU NEGATIVE 09/15/2017 1629   HGBUR NEGATIVE 09/15/2017 1629   BILIRUBINUR NEGATIVE 09/15/2017 1629   KETONESUR 5 (A) 09/15/2017 1629   PROTEINUR NEGATIVE 09/15/2017 1629   UROBILINOGEN 0.2 12/20/2014 1402   NITRITE NEGATIVE 09/15/2017 1629   LEUKOCYTESUR NEGATIVE 09/15/2017 1629   Sepsis Labs Invalid input(s): PROCALCITONIN,  WBC,  LACTICIDVEN Microbiology No results found for this or any previous visit (from the past 240 hour(s)).  Time coordinating discharge: 35 minutes  SIGNED:  Kerney Elbe, DO [facility] 09/18/2017, 11:14 AM Pager [phone]  If 7PM-7AM, please contact night-coverage [email or web address] Password [email or web address]

## 2017-09-18 NOTE — Care Management Important Message (Signed)
Important Message  Patient Details  Name: David [name]. MRN: [id number] Date of Birth: [birth year]   Medicare Important Message Given:  Yes    Kerin Salen 09/18/2017, 11:02 AMImportant Message  Patient Details  Name: David [name]. MRN: [id number] Date of Birth: 1951/01/30   Medicare Important Message Given:  Yes    Kerin Salen 09/18/2017, 11:02 AM

## 2017-09-18 NOTE — Progress Notes (Signed)
Patient given discharge instructions. He has no questions or concerns.

## 2017-09-18 NOTE — Progress Notes (Signed)
    CC: SBO  Subjective: Had a good day yesterday.  Is tolerating soft diet.  He had a good bowel movement last evening.  No further abdominal pain or discomfort.  Objective: Vital signs in last 24 hours: Temp:  [98.5 F (36.9 C)-98.8 F (37.1 C)] 98.8 F (37.1 C) (07/31 0514) Pulse Rate:  [59-71] 60 (07/31 0514) Resp:  [16-18] 16 (07/31 0514) BP: (110-136)/(63-83) 110/63 (07/31 0514) SpO2:  [94 %-98 %] 94 % (07/31 0514) Last BM Date: 09/16/17 630 p.o. listed he says he has had 3 meals with soft diet, as tolerated well. He reports a bowel movement around midnight. 2575 urine output Afebrile vital signs are stable. Labs remain normal potassium is 3.6 CBC is normal. Intake/Output from previous day: 07/30 0701 - 07/31 0700 In: 630 [P.O.:630] Out: 2575 [Urine:2575] Intake/Output this shift: No intake/output data recorded.  General appearance: alert, cooperative and no distress Resp: clear to auscultation bilaterally GI: Soft, nontender, positive bowel sounds tolerating a soft diet with BM yesterday.  Lab Results:  Recent Labs    09/16/17 0413 09/18/17 0412  WBC 6.5 7.7  HGB 13.5 12.4*  HCT 39.3 36.5*  PLT 237 229    BMET Recent Labs    09/17/17 0413 09/18/17 0412  NA 138 137  K 3.7 3.6  CL 102 103  CO2 30 27  GLUCOSE 99 103*  BUN 19 10  CREATININE 0.94 0.84  CALCIUM 8.6* 8.6*   PT/INR No results for input(s): LABPROT, INR in the last 72 hours.  Recent Labs  Lab 09/15/17 1628  AST 27  ALT 31  ALKPHOS 40  BILITOT 1.6*  PROT 7.7  ALBUMIN 4.1     Lipase     Component Value Date/Time   LIPASE 48 09/15/2017 1628     Medications: . diltiazem  180 mg Oral BID  . enoxaparin (LOVENOX) injection  40 mg Subcutaneous QHS  . feeding supplement (ENSURE ENLIVE)  237 mL Oral BID BM  .  morphine injection  4 mg Intravenous Once    Assessment/Plan CAD/history of PSVT GERD Chronic back and knee pain OSA -BiPAP  SBO/history of  cholecystectomy Acute kidney injury -creatinine down to 0.94  FEN: Soft diet  ID: None DVT: Lovenox  Plan: Small bowel obstruction appears to have resolved.  He feels much better this a.m., is tolerating soft diet well.  He has had 3 meals so far with a soft diet and no issues.  He can be discharged from our standpoint.         LOS: 3 days    Jamiria Langill 09/18/2017 [phone]

## 2017-09-25 DIAGNOSIS — N179 Acute kidney failure, unspecified: Secondary | ICD-10-CM | POA: Diagnosis not present

## 2017-09-25 DIAGNOSIS — I471 Supraventricular tachycardia: Secondary | ICD-10-CM | POA: Diagnosis not present

## 2017-09-25 DIAGNOSIS — I2510 Atherosclerotic heart disease of native coronary artery without angina pectoris: Secondary | ICD-10-CM | POA: Diagnosis not present

## 2017-09-25 DIAGNOSIS — K219 Gastro-esophageal reflux disease without esophagitis: Secondary | ICD-10-CM | POA: Diagnosis not present

## 2017-09-25 DIAGNOSIS — K56609 Unspecified intestinal obstruction, unspecified as to partial versus complete obstruction: Secondary | ICD-10-CM | POA: Diagnosis not present

## 2017-10-11 DIAGNOSIS — E875 Hyperkalemia: Secondary | ICD-10-CM | POA: Diagnosis not present

## 2017-10-30 DIAGNOSIS — M1812 Unilateral primary osteoarthritis of first carpometacarpal joint, left hand: Secondary | ICD-10-CM | POA: Diagnosis not present

## 2017-10-30 DIAGNOSIS — M1711 Unilateral primary osteoarthritis, right knee: Secondary | ICD-10-CM | POA: Diagnosis not present

## 2017-12-09 DIAGNOSIS — M25552 Pain in left hip: Secondary | ICD-10-CM | POA: Diagnosis not present

## 2017-12-09 DIAGNOSIS — R490 Dysphonia: Secondary | ICD-10-CM | POA: Diagnosis not present

## 2017-12-09 DIAGNOSIS — Z125 Encounter for screening for malignant neoplasm of prostate: Secondary | ICD-10-CM | POA: Diagnosis not present

## 2017-12-09 DIAGNOSIS — E441 Mild protein-calorie malnutrition: Secondary | ICD-10-CM | POA: Diagnosis not present

## 2017-12-09 DIAGNOSIS — K219 Gastro-esophageal reflux disease without esophagitis: Secondary | ICD-10-CM | POA: Diagnosis not present

## 2017-12-09 DIAGNOSIS — G4733 Obstructive sleep apnea (adult) (pediatric): Secondary | ICD-10-CM | POA: Diagnosis not present

## 2017-12-09 DIAGNOSIS — E7800 Pure hypercholesterolemia, unspecified: Secondary | ICD-10-CM | POA: Diagnosis not present

## 2017-12-09 DIAGNOSIS — Z0000 Encounter for general adult medical examination without abnormal findings: Secondary | ICD-10-CM | POA: Diagnosis not present

## 2017-12-09 DIAGNOSIS — I471 Supraventricular tachycardia: Secondary | ICD-10-CM | POA: Diagnosis not present

## 2017-12-09 DIAGNOSIS — Z23 Encounter for immunization: Secondary | ICD-10-CM | POA: Diagnosis not present

## 2017-12-09 DIAGNOSIS — N451 Epididymitis: Secondary | ICD-10-CM | POA: Diagnosis not present

## 2017-12-29 DIAGNOSIS — M545 Low back pain: Secondary | ICD-10-CM | POA: Diagnosis not present

## 2017-12-29 DIAGNOSIS — M546 Pain in thoracic spine: Secondary | ICD-10-CM | POA: Diagnosis not present

## 2017-12-30 DIAGNOSIS — M545 Low back pain: Secondary | ICD-10-CM | POA: Diagnosis not present

## 2018-01-02 DIAGNOSIS — M545 Low back pain: Secondary | ICD-10-CM | POA: Diagnosis not present

## 2018-01-06 DIAGNOSIS — M545 Low back pain: Secondary | ICD-10-CM | POA: Diagnosis not present

## 2018-01-09 DIAGNOSIS — M545 Low back pain: Secondary | ICD-10-CM | POA: Diagnosis not present

## 2018-02-17 DIAGNOSIS — H43812 Vitreous degeneration, left eye: Secondary | ICD-10-CM | POA: Diagnosis not present

## 2018-03-19 DIAGNOSIS — G4733 Obstructive sleep apnea (adult) (pediatric): Secondary | ICD-10-CM | POA: Diagnosis not present

## 2018-04-21 DIAGNOSIS — J029 Acute pharyngitis, unspecified: Secondary | ICD-10-CM | POA: Diagnosis not present

## 2018-04-21 DIAGNOSIS — J328 Other chronic sinusitis: Secondary | ICD-10-CM | POA: Diagnosis not present

## 2018-04-21 DIAGNOSIS — R52 Pain, unspecified: Secondary | ICD-10-CM | POA: Diagnosis not present

## 2018-04-21 DIAGNOSIS — R05 Cough: Secondary | ICD-10-CM | POA: Diagnosis not present

## 2018-06-04 DIAGNOSIS — M1711 Unilateral primary osteoarthritis, right knee: Secondary | ICD-10-CM | POA: Diagnosis not present

## 2018-06-04 DIAGNOSIS — M7061 Trochanteric bursitis, right hip: Secondary | ICD-10-CM | POA: Diagnosis not present

## 2018-07-16 DIAGNOSIS — M1711 Unilateral primary osteoarthritis, right knee: Secondary | ICD-10-CM | POA: Diagnosis not present

## 2018-12-11 DIAGNOSIS — Z125 Encounter for screening for malignant neoplasm of prostate: Secondary | ICD-10-CM | POA: Diagnosis not present

## 2018-12-11 DIAGNOSIS — G4733 Obstructive sleep apnea (adult) (pediatric): Secondary | ICD-10-CM | POA: Diagnosis not present

## 2018-12-11 DIAGNOSIS — Z1389 Encounter for screening for other disorder: Secondary | ICD-10-CM | POA: Diagnosis not present

## 2018-12-11 DIAGNOSIS — E7800 Pure hypercholesterolemia, unspecified: Secondary | ICD-10-CM | POA: Diagnosis not present

## 2018-12-11 DIAGNOSIS — Z0000 Encounter for general adult medical examination without abnormal findings: Secondary | ICD-10-CM | POA: Diagnosis not present

## 2018-12-11 DIAGNOSIS — I471 Supraventricular tachycardia: Secondary | ICD-10-CM | POA: Diagnosis not present

## 2018-12-11 DIAGNOSIS — M25552 Pain in left hip: Secondary | ICD-10-CM | POA: Diagnosis not present

## 2018-12-11 DIAGNOSIS — R490 Dysphonia: Secondary | ICD-10-CM | POA: Diagnosis not present

## 2018-12-11 DIAGNOSIS — K219 Gastro-esophageal reflux disease without esophagitis: Secondary | ICD-10-CM | POA: Diagnosis not present

## 2019-01-06 DIAGNOSIS — Z23 Encounter for immunization: Secondary | ICD-10-CM | POA: Diagnosis not present

## 2019-01-06 DIAGNOSIS — E7800 Pure hypercholesterolemia, unspecified: Secondary | ICD-10-CM | POA: Diagnosis not present

## 2019-01-06 DIAGNOSIS — Z125 Encounter for screening for malignant neoplasm of prostate: Secondary | ICD-10-CM | POA: Diagnosis not present

## 2019-01-19 DIAGNOSIS — H524 Presbyopia: Secondary | ICD-10-CM | POA: Diagnosis not present

## 2019-01-19 DIAGNOSIS — H5203 Hypermetropia, bilateral: Secondary | ICD-10-CM | POA: Diagnosis not present

## 2019-01-19 DIAGNOSIS — H2513 Age-related nuclear cataract, bilateral: Secondary | ICD-10-CM | POA: Diagnosis not present

## 2019-03-11 ENCOUNTER — Ambulatory Visit: Payer: Medicare Other | Attending: Internal Medicine

## 2019-03-11 DIAGNOSIS — Z23 Encounter for immunization: Secondary | ICD-10-CM | POA: Diagnosis not present

## 2019-03-11 NOTE — Progress Notes (Signed)
   [id number] Vaccination [facility]  Name:  Jayleen Joas.    MRN: [id number] DOB: 11/12/1950  03/11/2019  Mr. Reddinger was observed post Covid-19 immunization for 15 minutes without incidence. He was provided with Vaccine Information Sheet and instruction to access the V-Safe system.   Mr. Schiffler was instructed to call 911 with any severe reactions post vaccine: Marland Kitchen Difficulty breathing  . Swelling of your face and throat  . A fast heartbeat  . A bad rash all over your body  . Dizziness and weakness    Immunizations Administered    Name Date Dose VIS Date Route   Pfizer COVID-19 Vaccine 03/11/2019  5:20 PM 0.3 mL 01/30/2019 Intramuscular   Manufacturer: [employer]   Lot: [id number]   [facility]: [id number]

## 2019-03-18 DIAGNOSIS — G4733 Obstructive sleep apnea (adult) (pediatric): Secondary | ICD-10-CM | POA: Diagnosis not present

## 2019-04-01 ENCOUNTER — Ambulatory Visit: Payer: Medicare Other | Attending: Internal Medicine

## 2019-04-01 DIAGNOSIS — Z23 Encounter for immunization: Secondary | ICD-10-CM | POA: Insufficient documentation

## 2019-04-01 NOTE — Progress Notes (Signed)
   [id number] Vaccination Clinic  Name:  David [name].    MRN: [id number] DOB: 12-04-1950  04/01/2019  David [name] was observed post Covid-19 immunization for 15 minutes without incidence. He was provided with Vaccine Information Sheet and instruction to access the V-Safe system.   David [name] was instructed to call 911 with any severe reactions post vaccine: Marland Kitchen Difficulty breathing  . Swelling of your face and throat  . A fast heartbeat  . A bad rash all over your body  . Dizziness and weakness    Immunizations Administered    Name Date Dose VIS Date Route   Pfizer COVID-19 Vaccine 04/01/2019 11:55 AM 0.3 mL 01/30/2019 Intramuscular   Manufacturer: [place]   Lot: [id number]   [place]: [id number]

## 2019-04-22 DIAGNOSIS — G5761 Lesion of plantar nerve, right lower limb: Secondary | ICD-10-CM | POA: Diagnosis not present

## 2019-11-18 DIAGNOSIS — Z23 Encounter for immunization: Secondary | ICD-10-CM | POA: Diagnosis not present

## 2019-11-22 DIAGNOSIS — R509 Fever, unspecified: Secondary | ICD-10-CM | POA: Diagnosis not present

## 2019-12-14 DIAGNOSIS — M25552 Pain in left hip: Secondary | ICD-10-CM | POA: Diagnosis not present

## 2019-12-14 DIAGNOSIS — I471 Supraventricular tachycardia: Secondary | ICD-10-CM | POA: Diagnosis not present

## 2019-12-14 DIAGNOSIS — E7800 Pure hypercholesterolemia, unspecified: Secondary | ICD-10-CM | POA: Diagnosis not present

## 2019-12-14 DIAGNOSIS — Z1389 Encounter for screening for other disorder: Secondary | ICD-10-CM | POA: Diagnosis not present

## 2019-12-14 DIAGNOSIS — Z23 Encounter for immunization: Secondary | ICD-10-CM | POA: Diagnosis not present

## 2019-12-14 DIAGNOSIS — Z125 Encounter for screening for malignant neoplasm of prostate: Secondary | ICD-10-CM | POA: Diagnosis not present

## 2019-12-14 DIAGNOSIS — G4733 Obstructive sleep apnea (adult) (pediatric): Secondary | ICD-10-CM | POA: Diagnosis not present

## 2019-12-14 DIAGNOSIS — R5383 Other fatigue: Secondary | ICD-10-CM | POA: Diagnosis not present

## 2019-12-14 DIAGNOSIS — R634 Abnormal weight loss: Secondary | ICD-10-CM | POA: Diagnosis not present

## 2019-12-14 DIAGNOSIS — Z0000 Encounter for general adult medical examination without abnormal findings: Secondary | ICD-10-CM | POA: Diagnosis not present

## 2019-12-14 DIAGNOSIS — K219 Gastro-esophageal reflux disease without esophagitis: Secondary | ICD-10-CM | POA: Diagnosis not present

## 2019-12-24 DIAGNOSIS — R5383 Other fatigue: Secondary | ICD-10-CM | POA: Diagnosis not present

## 2019-12-24 DIAGNOSIS — R634 Abnormal weight loss: Secondary | ICD-10-CM | POA: Diagnosis not present

## 2020-01-11 DIAGNOSIS — R748 Abnormal levels of other serum enzymes: Secondary | ICD-10-CM | POA: Diagnosis not present

## 2020-01-18 DIAGNOSIS — R059 Cough, unspecified: Secondary | ICD-10-CM | POA: Diagnosis not present

## 2020-01-18 DIAGNOSIS — J069 Acute upper respiratory infection, unspecified: Secondary | ICD-10-CM | POA: Diagnosis not present

## 2020-02-03 DIAGNOSIS — R059 Cough, unspecified: Secondary | ICD-10-CM | POA: Diagnosis not present

## 2020-02-03 DIAGNOSIS — U071 COVID-19: Secondary | ICD-10-CM | POA: Diagnosis not present

## 2020-02-04 ENCOUNTER — Other Ambulatory Visit: Payer: Self-pay | Admitting: Family

## 2020-02-04 ENCOUNTER — Telehealth: Payer: Self-pay | Admitting: Family

## 2020-02-04 DIAGNOSIS — U071 COVID-19: Secondary | ICD-10-CM

## 2020-02-04 DIAGNOSIS — E7800 Pure hypercholesterolemia, unspecified: Secondary | ICD-10-CM

## 2020-02-04 NOTE — Telephone Encounter (Signed)
Called to Discuss with patient about Covid symptoms and the use of the monoclonal antibody infusion for those with mild to moderate Covid symptoms and at a high risk of hospitalization.     Pt appears to qualify for this infusion due to co-morbid conditions and/or a member of an at-risk group in accordance with the FDA Emergency Use Authorization.    Mr. Ballen had symptoms starting on 12/14 and continues to have sore throat, headaches, fatigue, and cough. Tested positive on 12/15 at [facility]. Qualifying risk factors include age and hyperlipidemia.   I spoke with Mr. Peters regarding the risks, benefits and potential financial costs associated with treatment with monoclonal antibodies and he wishes to proceed with treatment.   Hello [name],   We have contacted you because you have recently been diagnosed with COVID-19 with active symptoms of less than 10 days in duration and may benefit from a new treatment for mild to moderate disease. This treatment has been shown to reduce the risk of hospitalization and decrease the risk for severe symptoms related to COVID-19 in a select group of patients with risk factors / medical conditions we believe put people at a higher risk for this infection.    The Food and Drug Administration (FDA) has given emergency use of a new medication to treat those with mild to moderate symptoms who have risk factors that have been identified for severe symptoms related to COVID-19. This new treatment is a monoclonal antibody - the way it works its that it attaches like a magnet to the SARS-CoV2 virus (the virus that causes COVID-19) and stops it from infecting more cells in your body. It does not kill the virus but it prevents it from spreading throughout your body with the hope that it will decrease your symptoms after it is given.    This new medication is an intravenous (IV) infusion that is given over one half hour in our outpatient infusion clinic here  in [place]. We will require you to stay roughly 60 minutes after the infusion to ensure you are tolerating it and watch for any allergic reaction to the medication. More information will also be given to you at the time of your appointment. There are three  different medications. The medications are called Bamlanivimab & Etesevimab (by Waynetta Sandy) or [place] (by Regeneron) or Sotrovimab.   The side effects that have been seen with this treatment:  2-4% of recipients experience nausea, vomiting, diarrhea, dizziness, headaches, itching, worsening fevers or chills for ~24 hours.  There have been no serious infusion related reactions  Of the over 3000 patients who received the infusion one did have an allergic response that resolved once the infusion was stopped - this is why we monitor all of our patients closely for an hour after the infusion.   The covid 19 vaccine (including boosters) must be delayed at least 90 days after receiving this infusion.   The medication itself is free, but your insurance will be charged an infusion fee. The amount you may owe later varies from insurance to insurance. If you do not have insurance, we can put you in touch with our billing department. The CMS codes are:  [phone] or [phone] or [phone]   You have been scheduled to receive the monoclonal antibody therapy at [facility]: 02/05/20 at 2:30pm  If you have been tested outside of a [facility] - you MUST bring a copy of your positive test with you  the morning of your appointment. You may take a photo of this and upload to your MyChart portal or have the testing facility fax the result to [phone]    The address for the infusion clinic site is:  --GPS address is [place] - the parking is located near [employer] building where you will see  COVID19 Infusion feather banner marking the entrance to parking.   (see photos below)            --Enter into the 2nd entrance where  the "wave, flag banner" is at the road. Turn into this 2nd entrance and immediately turn left to park in 1 of the 5 parking spots.   --Please stay in your car and call the desk for assistance inside [phone].   --Average time in department is roughly 90 minutes to 2 hours for monoclonal treatment - this includes preparation of the medication, IV start and the required 1 hour monitoring after the infusion.    Should you develop worsening shortness of breath, chest pain or severe breathing problems please do not wait for this appointment and go to the Emergency room for evaluation and treatment. You will undergo another oxygen screen before your infusion to ensure this is the best treatment option for you. There is a chance that the best decision may be to send you to the Emergency Room for evaluation at the time of your appointment.   The day of your visit you should: Marland Kitchen Get plenty of rest the night before and drink plenty of water . Eat a light meal/snack before coming and take your medications as prescribed  . Wear warm, comfortable clothes with a shirt that can roll-up over the elbow (will need IV start).  . Wear a mask  . Consider bringing some activity to help pass the time   The medication itself is free, paid for by government COVID funding relief, and many commercial insurers are waiving bills related to COVID treatment, however some have sent bills to patients later for the administration fees related to the medication.  Please contact your insurance agent to discuss prior to your appointment if you would like further details about billing specific to your policy.    Billing codes are: N4627 or O3500 or [place], NP 02/04/2020 5:36 PM

## 2020-02-04 NOTE — Progress Notes (Signed)
I connected by phone with [name] on 02/04/2020 at 5:36 PM to discuss the potential use of a new treatment for mild to moderate COVID-19 viral infection in non-hospitalized patients.  This patient is a 69 y.o. male that meets the FDA criteria for Emergency Use Authorization of COVID monoclonal antibody casirivimab/imdevimab, bamlanivimab/etesevimab, or sotrovimab.  Has a (+) direct SARS-CoV-2 viral test result  Has mild or moderate COVID-19   Is NOT hospitalized due to COVID-19  Is within 10 days of symptom onset  Has at least one of the high risk factor(s) for progression to severe COVID-19 and/or hospitalization as defined in EUA.  Specific high risk criteria : Older age (>/= 68 yo) and Cardiovascular disease or hypertension   I have spoken and communicated the following to the patient or parent/caregiver regarding COVID monoclonal antibody treatment:  1. FDA has authorized the emergency use for the treatment of mild to moderate COVID-19 in adults and pediatric patients with positive results of direct SARS-CoV-2 viral testing who are 51 years of age and older weighing at least 40 kg, and who are at high risk for progressing to severe COVID-19 and/or hospitalization.  2. The significant known and potential risks and benefits of COVID monoclonal antibody, and the extent to which such potential risks and benefits are unknown.  3. Information on available alternative treatments and the risks and benefits of those alternatives, including clinical trials.  4. Patients treated with COVID monoclonal antibody should continue to self-isolate and use infection control measures (e.g., wear mask, isolate, social distance, avoid sharing personal items, clean and disinfect "high touch" surfaces, and frequent handwashing) according to CDC guidelines.   5. The patient or parent/caregiver has the option to accept or refuse COVID monoclonal antibody treatment.  After reviewing this  information with the patient, the patient has agreed to receive one of the available covid 19 monoclonal antibodies and will be provided an appropriate fact sheet prior to infusion. Mauricio Po, FNP 02/04/2020 5:36 PM

## 2020-02-05 ENCOUNTER — Ambulatory Visit (HOSPITAL_COMMUNITY)
Admission: RE | Admit: 2020-02-05 | Discharge: 2020-02-05 | Disposition: A | Discharge status: Home / Self Care | Payer: Medicare Other | Source: Ambulatory Visit | Attending: Pulmonary Disease | Admitting: Pulmonary Disease

## 2020-02-05 DIAGNOSIS — U071 COVID-19: Secondary | ICD-10-CM | POA: Diagnosis not present

## 2020-02-05 DIAGNOSIS — Z23 Encounter for immunization: Secondary | ICD-10-CM | POA: Diagnosis not present

## 2020-02-05 DIAGNOSIS — E7800 Pure hypercholesterolemia, unspecified: Secondary | ICD-10-CM | POA: Diagnosis not present

## 2020-02-05 MED ORDER — DIPHENHYDRAMINE HCL 50 MG/ML IJ SOLN: 50.0000 mg | Freq: Once | INTRAMUSCULAR | Status: DC | PRN

## 2020-02-05 MED ORDER — SODIUM CHLORIDE 0.9 % IV SOLN: Freq: Once | INTRAVENOUS | Status: AC

## 2020-02-05 MED ORDER — SODIUM CHLORIDE 0.9 % IV SOLN: INTRAVENOUS | Status: DC | PRN

## 2020-02-05 MED ORDER — METHYLPREDNISOLONE SODIUM SUCC 125 MG IJ SOLR: 125.0000 mg | Freq: Once | INTRAMUSCULAR | Status: DC | PRN

## 2020-02-05 MED ORDER — EPINEPHRINE 0.3 MG/0.3ML IJ SOAJ: 0.3000 mg | Freq: Once | INTRAMUSCULAR | Status: DC | PRN

## 2020-02-05 MED ORDER — ALBUTEROL SULFATE HFA 108 (90 BASE) MCG/ACT IN AERS: 2.0000 | INHALATION_SPRAY | Freq: Once | RESPIRATORY_TRACT | Status: DC | PRN

## 2020-02-05 MED ORDER — FAMOTIDINE IN NACL 20-0.9 MG/50ML-% IV SOLN: 20.0000 mg | Freq: Once | INTRAVENOUS | Status: DC | PRN

## 2020-02-05 NOTE — Progress Notes (Signed)
Patient reviewed Fact Sheet for Patients, Parents, and Caregivers for Emergency Use Authorization (EUA) of bamlanivimab and etesevimab for the Treatment of Coronavirus. Patient also reviewed and is agreeable to the estimated cost of treatment. Patient is agreeable to proceed.   

## 2020-02-05 NOTE — Progress Notes (Signed)
  Diagnosis: COVID-19  Physician: Dr. Joya Gaskins  Procedure: Covid Infusion Clinic Med: bamlanivimab\etesevimab infusion - Provided patient with bamlanimivab\etesevimab fact sheet for patients, parents and caregivers prior to infusion.  Complications: No immediate complications noted.  Discharge: Discharged home   Ludwig Lean 02/05/2020

## 2020-02-05 NOTE — Discharge Instructions (Signed)
10 Things You Can Do to Manage Your COVID-19 Symptoms at Home If you have possible or confirmed COVID-19: 1. Stay home from work and school. And stay away from other public places. If you must go out, avoid using any kind of public transportation, ridesharing, or taxis. 2. Monitor your symptoms carefully. If your symptoms get worse, call your healthcare provider immediately. 3. Get rest and stay hydrated. 4. If you have a medical appointment, call the healthcare provider ahead of time and tell them that you have or may have COVID-19. 5. For medical emergencies, call 911 and notify the dispatch personnel that you have or may have COVID-19. 6. Cover your cough and sneezes with a tissue or use the inside of your elbow. 7. Wash your hands often with soap and water for at least 20 seconds or clean your hands with an alcohol-based hand sanitizer that contains at least 60% alcohol. 8. As much as possible, stay in a specific room and away from other people in your home. Also, you should use a separate bathroom, if available. If you need to be around other people in or outside of the home, wear a mask. 9. Avoid sharing personal items with other people in your household, like dishes, towels, and bedding. 10. Clean all surfaces that are touched often, like counters, tabletops, and doorknobs. Use household cleaning sprays or wipes according to the label instructions. cdc.gov/coronavirus 08/20/2018 This information is not intended to replace advice given to you by your health care provider. Make sure you discuss any questions you have with your health care provider. Document Revised: 01/22/2019 Document Reviewed: 01/22/2019 Elsevier Patient Education  2020 Elsevier Inc. What types of side effects do monoclonal antibody drugs cause?  Common side effects  In general, the more common side effects caused by monoclonal antibody drugs include: . Allergic reactions, such as hives or itching . Flu-like signs and  symptoms, including chills, fatigue, fever, and muscle aches and pains . Nausea, vomiting . Diarrhea . Skin rashes . Low blood pressure   The CDC is recommending patients who receive monoclonal antibody treatments wait at least 90 days before being vaccinated.  Currently, there are no data on the safety and efficacy of mRNA COVID-19 vaccines in persons who received monoclonal antibodies or convalescent plasma as part of COVID-19 treatment. Based on the estimated half-life of such therapies as well as evidence suggesting that reinfection is uncommon in the 90 days after initial infection, vaccination should be deferred for at least 90 days, as a precautionary measure until additional information becomes available, to avoid interference of the antibody treatment with vaccine-induced immune responses. If you have any questions or concerns after the infusion please call the Advanced Practice Provider on call at [phone]. This number is ONLY intended for your use regarding questions or concerns about the infusion post-treatment side-effects.  Please do not provide this number to others for use. For return to work notes please contact your primary care provider.   If someone you know is interested in receiving treatment please have them call the COVID hotline at [phone].   

## 2020-02-08 DIAGNOSIS — U071 COVID-19: Secondary | ICD-10-CM | POA: Diagnosis not present

## 2020-02-08 DIAGNOSIS — Z9189 Other specified personal risk factors, not elsewhere classified: Secondary | ICD-10-CM | POA: Diagnosis not present

## 2020-02-22 DIAGNOSIS — H524 Presbyopia: Secondary | ICD-10-CM | POA: Diagnosis not present

## 2020-02-22 DIAGNOSIS — H5203 Hypermetropia, bilateral: Secondary | ICD-10-CM | POA: Diagnosis not present

## 2020-02-22 DIAGNOSIS — H25813 Combined forms of age-related cataract, bilateral: Secondary | ICD-10-CM | POA: Diagnosis not present

## 2020-03-02 DIAGNOSIS — Z961 Presence of intraocular lens: Secondary | ICD-10-CM | POA: Diagnosis not present

## 2020-03-02 DIAGNOSIS — H25812 Combined forms of age-related cataract, left eye: Secondary | ICD-10-CM | POA: Diagnosis not present

## 2020-03-02 DIAGNOSIS — Z9841 Cataract extraction status, right eye: Secondary | ICD-10-CM | POA: Diagnosis not present

## 2020-03-02 DIAGNOSIS — Z48810 Encounter for surgical aftercare following surgery on the sense organs: Secondary | ICD-10-CM | POA: Diagnosis not present

## 2020-03-02 DIAGNOSIS — H25011 Cortical age-related cataract, right eye: Secondary | ICD-10-CM | POA: Diagnosis not present

## 2020-03-02 DIAGNOSIS — H2511 Age-related nuclear cataract, right eye: Secondary | ICD-10-CM | POA: Diagnosis not present

## 2020-03-21 DIAGNOSIS — G4733 Obstructive sleep apnea (adult) (pediatric): Secondary | ICD-10-CM | POA: Diagnosis not present

## 2020-03-23 DIAGNOSIS — H25012 Cortical age-related cataract, left eye: Secondary | ICD-10-CM | POA: Diagnosis not present

## 2020-03-23 DIAGNOSIS — H2512 Age-related nuclear cataract, left eye: Secondary | ICD-10-CM | POA: Diagnosis not present

## 2020-07-18 DIAGNOSIS — R059 Cough, unspecified: Secondary | ICD-10-CM | POA: Diagnosis not present

## 2020-07-18 DIAGNOSIS — J069 Acute upper respiratory infection, unspecified: Secondary | ICD-10-CM | POA: Diagnosis not present

## 2020-08-23 DIAGNOSIS — U071 COVID-19: Secondary | ICD-10-CM | POA: Diagnosis not present

## 2020-08-23 DIAGNOSIS — Z20822 Contact with and (suspected) exposure to covid-19: Secondary | ICD-10-CM | POA: Diagnosis not present

## 2020-10-18 DIAGNOSIS — Z961 Presence of intraocular lens: Secondary | ICD-10-CM | POA: Diagnosis not present

## 2020-10-18 DIAGNOSIS — H26491 Other secondary cataract, right eye: Secondary | ICD-10-CM | POA: Diagnosis not present

## 2020-10-26 DIAGNOSIS — H26491 Other secondary cataract, right eye: Secondary | ICD-10-CM | POA: Diagnosis not present

## 2020-11-22 DIAGNOSIS — Z6822 Body mass index (BMI) 22.0-22.9, adult: Secondary | ICD-10-CM | POA: Diagnosis not present

## 2020-11-22 DIAGNOSIS — J690 Pneumonitis due to inhalation of food and vomit: Secondary | ICD-10-CM | POA: Diagnosis not present

## 2020-12-21 DIAGNOSIS — Z23 Encounter for immunization: Secondary | ICD-10-CM | POA: Diagnosis not present

## 2020-12-21 DIAGNOSIS — M25552 Pain in left hip: Secondary | ICD-10-CM | POA: Diagnosis not present

## 2020-12-21 DIAGNOSIS — Z1389 Encounter for screening for other disorder: Secondary | ICD-10-CM | POA: Diagnosis not present

## 2020-12-21 DIAGNOSIS — E7800 Pure hypercholesterolemia, unspecified: Secondary | ICD-10-CM | POA: Diagnosis not present

## 2020-12-21 DIAGNOSIS — Z0000 Encounter for general adult medical examination without abnormal findings: Secondary | ICD-10-CM | POA: Diagnosis not present

## 2020-12-21 DIAGNOSIS — K219 Gastro-esophageal reflux disease without esophagitis: Secondary | ICD-10-CM | POA: Diagnosis not present

## 2020-12-21 DIAGNOSIS — I471 Supraventricular tachycardia: Secondary | ICD-10-CM | POA: Diagnosis not present

## 2020-12-21 DIAGNOSIS — Z125 Encounter for screening for malignant neoplasm of prostate: Secondary | ICD-10-CM | POA: Diagnosis not present

## 2020-12-21 DIAGNOSIS — R634 Abnormal weight loss: Secondary | ICD-10-CM | POA: Diagnosis not present

## 2020-12-21 DIAGNOSIS — G4733 Obstructive sleep apnea (adult) (pediatric): Secondary | ICD-10-CM | POA: Diagnosis not present

## 2020-12-21 DIAGNOSIS — R5383 Other fatigue: Secondary | ICD-10-CM | POA: Diagnosis not present

## 2021-04-13 DIAGNOSIS — G4733 Obstructive sleep apnea (adult) (pediatric): Secondary | ICD-10-CM | POA: Diagnosis not present

## 2021-07-01 ENCOUNTER — Other Ambulatory Visit: Payer: Self-pay

## 2021-07-01 ENCOUNTER — Encounter: Payer: Self-pay | Admitting: Emergency Medicine

## 2021-07-01 ENCOUNTER — Emergency Department
Admission: EM | Admit: 2021-07-01 | Discharge: 2021-07-01 | Discharge status: Against Medical Advice | Payer: Medicare HMO | Attending: Emergency Medicine | Admitting: Emergency Medicine

## 2021-07-01 ENCOUNTER — Emergency Department: Payer: Medicare HMO

## 2021-07-01 DIAGNOSIS — R42 Dizziness and giddiness: Secondary | ICD-10-CM | POA: Diagnosis not present

## 2021-07-01 DIAGNOSIS — R202 Paresthesia of skin: Secondary | ICD-10-CM | POA: Diagnosis not present

## 2021-07-01 DIAGNOSIS — R079 Chest pain, unspecified: Secondary | ICD-10-CM | POA: Diagnosis not present

## 2021-07-01 DIAGNOSIS — Z5321 Procedure and treatment not carried out due to patient leaving prior to being seen by health care provider: Secondary | ICD-10-CM | POA: Insufficient documentation

## 2021-07-01 DIAGNOSIS — I10 Essential (primary) hypertension: Secondary | ICD-10-CM | POA: Diagnosis not present

## 2021-07-01 DIAGNOSIS — R0789 Other chest pain: Secondary | ICD-10-CM | POA: Insufficient documentation

## 2021-07-01 LAB — BASIC METABOLIC PANEL
Anion gap: 4 — ABNORMAL LOW (ref 5–15)
BUN: 19 mg/dL (ref 8–23)
CO2: 26 mmol/L (ref 22–32)
Calcium: 9.1 mg/dL (ref 8.9–10.3)
Chloride: 106 mmol/L (ref 98–111)
Creatinine, Ser: 0.93 mg/dL (ref 0.61–1.24)
GFR, Estimated: >60 mL/min (ref >60)
Glucose, Bld: 109 mg/dL — ABNORMAL HIGH (ref 70–99)
Potassium: 4.3 mmol/L (ref 3.5–5.1)
Sodium: 136 mmol/L (ref 135–145)

## 2021-07-01 LAB — CBC
HCT: 40.8 % (ref 39.0–52.0)
Hemoglobin: 13.3 g/dL (ref 13.0–17.0)
MCH: 29.9 pg (ref 26.0–34.0)
MCHC: 32.6 g/dL (ref 30.0–36.0)
MCV: 91.7 fL (ref 80.0–100.0)
Platelets: 249 10*3/uL (ref 150–400)
RBC: 4.45 MIL/uL (ref 4.22–5.81)
RDW: 12.6 % (ref 11.5–15.5)
WBC: 5.3 10*3/uL (ref 4.0–10.5)
nRBC: 0 % (ref 0.0–0.2)

## 2021-07-01 LAB — TROPONIN I (HIGH SENSITIVITY): Troponin I (High Sensitivity): 6 ng/L (ref <18)

## 2021-07-01 NOTE — ED Triage Notes (Signed)
Pt in via [facility] from home with c/o left arm tingling and chest pressure since 0840 this am. Pt took 4 low dose asa prior to EMS arrival. HR 60, no SOB, no recent illness. Pt reports intermittent dizziness over the last week. 98% RA, 141/83, CBG 122, HR 60, #18 g to left AC.  ? ?Pt reports this am her got up felt fine and then he started with pressure in his chest, lightheadedness and numbness and tingling in his left arm. Pt states he takes diltiazem for an irregular HR.  ?

## 2021-07-01 NOTE — ED Notes (Signed)
Pt to be wheeled to room 14 per first nurse, while this RN was taking pt to room, first nurse informed this RN that room 14 was taken by emergency traffic. Pt wheeled back to lobby, pt yelling and angry that he was not being taken to room. Pt told he would get the next room. Pt yelling about waiting and said he was leaving, pt said he could walk, pt got out of wheelchair and walked towards door, pt stated he was leaving ER, first nurse notified.  ?

## 2021-07-06 DIAGNOSIS — Z8679 Personal history of other diseases of the circulatory system: Secondary | ICD-10-CM | POA: Diagnosis not present

## 2021-07-06 DIAGNOSIS — Z87898 Personal history of other specified conditions: Secondary | ICD-10-CM | POA: Diagnosis not present

## 2021-07-06 DIAGNOSIS — I4510 Unspecified right bundle-branch block: Secondary | ICD-10-CM | POA: Diagnosis not present

## 2021-07-07 ENCOUNTER — Ambulatory Visit (INDEPENDENT_AMBULATORY_CARE_PROVIDER_SITE_OTHER): Payer: Medicare HMO | Admitting: Cardiovascular Disease

## 2021-07-07 ENCOUNTER — Encounter: Payer: Self-pay | Admitting: Cardiovascular Disease

## 2021-07-07 DIAGNOSIS — G4733 Obstructive sleep apnea (adult) (pediatric): Secondary | ICD-10-CM

## 2021-07-07 DIAGNOSIS — R011 Cardiac murmur, unspecified: Secondary | ICD-10-CM | POA: Diagnosis not present

## 2021-07-07 DIAGNOSIS — Z9989 Dependence on other enabling machines and devices: Secondary | ICD-10-CM | POA: Diagnosis not present

## 2021-07-07 DIAGNOSIS — R0602 Shortness of breath: Secondary | ICD-10-CM

## 2021-07-07 DIAGNOSIS — I471 Supraventricular tachycardia: Secondary | ICD-10-CM

## 2021-07-07 DIAGNOSIS — R42 Dizziness and giddiness: Secondary | ICD-10-CM

## 2021-07-07 DIAGNOSIS — I4510 Unspecified right bundle-branch block: Secondary | ICD-10-CM | POA: Diagnosis not present

## 2021-07-07 DIAGNOSIS — R072 Precordial pain: Secondary | ICD-10-CM

## 2021-07-07 DIAGNOSIS — R0789 Other chest pain: Secondary | ICD-10-CM

## 2021-07-07 MED ORDER — METOPROLOL TARTRATE 25 MG PO TABS
ORAL_TABLET | ORAL | 0 refills | Status: DC
Start: 2021-07-07 — End: 2021-09-06

## 2021-07-07 NOTE — Patient Instructions (Addendum)
Medication Instructions:  Continue same medications   Lab Work: Lipid panel,LPa,tsh    Testing/Procedures: Echo  Coronary CT   will be scheduled after approved by insurance    Follow instructions below   Follow-Up: At [facility], you and your health needs are our priority.  As part of our continuing mission to provide you with exceptional heart care, we have created designated Provider Care Teams.  These Care Teams include your primary Cardiologist (physician) and Advanced Practice Providers (APPs -  Physician Assistants and Nurse Practitioners) who all work together to provide you with the care you need, when you need it.  We recommend signing up for the patient portal called "MyChart".  Sign up information is provided on this After Visit Summary.  MyChart is used to connect with patients for Virtual Visits (Telemedicine).  Patients are able to view lab/test results, encounter notes, upcoming appointments, etc.  Non-urgent messages can be sent to your provider as well.   To learn more about what you can do with MyChart, go to [email or web address].    Your next appointment:  The end of June    The format for your next appointment: Office   Provider:  [facility]       Your cardiac CT will be scheduled at one of the below locations:   [facility] [address] [place], [place] [address] [phone]  [facility] [address] [place],  [address] [phone]  If scheduled at [facility], please arrive at the [facility] and Children's Entrance (Entrance C2) of [facility] 30 minutes prior to test start time. You can use the FREE valet parking offered at entrance C (encouraged to control the heart rate for the test)  Proceed to the [facility] Radiology Department (first floor) to check-in and test prep.  All radiology patients and guests should use entrance C2 at [facility], accessed from [facility], even though the hospital's physical address listed is [address].    If scheduled at [facility], please arrive 15 mins early for check-in and test prep.  Please follow these instructions carefully (unless otherwise directed):  Hold all erectile dysfunction medications at least 3 days (72 hrs) prior to test.  On the Night Before the Test: Be sure to Drink plenty of water. Do not consume any caffeinated/decaffeinated beverages or chocolate 12 hours prior to your test. Do not take any antihistamines 12 hours prior to your test.   On the Day of the Test: Drink plenty of water until 1 hour prior to the test. Do not eat any food 4 hours prior to the test. You may take your regular medications prior to the test.  Take metoprolol 25 mg. two hours prior to test.         After the Test: Drink plenty of water. After receiving IV contrast, you may experience a mild flushed feeling. This is normal. On occasion, you may experience a mild rash up to 24 hours after the test. This is not dangerous. If this occurs, you can take Benadryl 25 mg and increase your fluid intake. If you experience trouble breathing, this can be serious. If it is severe call 911 IMMEDIATELY. If it is mild, please call our office.  We will call to schedule your test 2-4 weeks out understanding that some insurance companies will need an authorization prior to the service being performed.   For non-scheduling  related questions, please contact the cardiac imaging nurse navigator should you have any questions/concerns: Marchia Bond, Cardiac Imaging Nurse Navigator Gordy Clement, Cardiac Imaging Nurse Navigator [facility] Direct Office Dial: [phone]   For scheduling needs, including cancellations and rescheduling, please call [place], [phone].   Important Information About Sugar

## 2021-07-07 NOTE — Progress Notes (Signed)
Cardiology Office Note    Date:  07/16/2021   ID:  David Manes., DOB 11-22-1950, MRN [id number]  PCP:  Maury Dus, MD  Cardiologist:  Shelva Majestic, MD   New cardiology duration, referred through [facility] and [facility] following the ER evaluation where he waited for over 5 hours left prior to being seen.  History of Present Illness:  David Mcclafferty. is a 71 y.o. male who is followed by Dr. Dierdre Forth  at [place].  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  He has a history of obstructive sleep apnea since 2009 and is on CPAP therapy.  Over the past 4 to 6 weeks, he has noticed some very mild lightheadedness.  He states he typically exercises and does treadmill, cross training and an elliptical exercise.  On Saturday, May 13 he developed left arm numbness and some slight chest pressure.  Due to persistence of this discomfort he ultimately went to the emergency room at [facility].  He apparently had waited for over 4 hours and was about to be brought back but then there was taken and he was put back in the waiting room.  He waited an additional hour and a half and apparently was no longer on the list to be seen and ultimately left without being seen.  He was seen on May 18 by Dr. Rockwell Germany in [place] [facility] and cardiology evaluation was advised.  His initial ECG on Jul 01, 2021 in the [facility] ER showed sinus bradycardia 59 bpm, PR interval was slightly short at 110 ms.  There is no evidence for preexcitation.  Right bundle branch block was present which apparently was new.  Presently, he denies any recurrent chest or arm discomfort.  He denies any syncope and states his PSVT has been controlled on diltiazem.  He continues to use CPAP.  He has been on rosuvastatin 10 mg for hyperlipidemia, and Nexium for GERD.  He presents for cardiology evaluation.   Past Medical History:  Diagnosis Date   Back pain, chronic     CAD (coronary artery disease) 2009   nonobstructive by cath   Chronic knee pain    Chronic low back pain    Dysrhythmia    GERD (gastroesophageal reflux disease)    History of PSVT (paroxysmal supraventricular tachycardia) 2009   nonsustained atach per Dr Marlou Porch on monitor in 2009. treated with diltiazem   Hyperlipidemia    INTOLERANT OF STATINS   Hypertension    Knee pain, right    PARTICULARY AT NIGHT   Near syncope 07/23/2014   OSA (obstructive sleep apnea)    BIPAP   Palpitations     Past Surgical History:  Procedure Laterality Date   ANTERIOR CERVICAL DECOMPRESSION/DISCECTOMY FUSION 4 LEVELS N/A 12/22/2014   Procedure: ANTERIOR CERVICAL DECOMPRESSION/DISCECTOMY FUSION 4 LEVELS;  Surgeon: Phylliss Bob, MD;  Location: [place];  Service: Orthopedics;  Laterality: N/A;  Anteriro cervical decompression fusion, cervical 3-4, cervical 4-5, cervical 5-6, cervical 6-7 with instrumentation and allograft   CHOLECYSTECTOMY     COLONOSCOPY     2003, 2008, 2013   CORONARY ANGIOPLASTY  07/2007   NONOBSTRUCTIVE CORONARY PLAQUING- MINOR 20 % OSTIAL DIAGONAL STENOSIS, NORMAL LEFT VENTRICULAR EF 55%   ERCP  2006   KNEE ARTHROSCOPY Right 1982   LAPAROSCOPIC CHOLECYSTECTOMY  2006   VASECTOMY  1980    Current Medications:  Outpatient Medications Prior to Visit  Medication Sig Dispense Refill   aspirin EC 81 MG tablet Take 81 mg by mouth daily.     diltiazem (DILACOR XR) 180 MG 24 hr capsule Take 180 mg by mouth 2 (two) times daily.      esomeprazole (NEXIUM) 40 MG capsule Take 40 mg by mouth daily.      Multiple Vitamins-Minerals (MULTIVITAMIN WITH MINERALS) tablet Take 1 tablet by mouth daily.     rosuvastatin (CRESTOR) 10 MG tablet Take 10 mg by mouth at bedtime.      SHINGRIX injection      feeding supplement, ENSURE ENLIVE, (ENSURE ENLIVE) LIQD Take 237 mLs by mouth 2 (two) times daily between meals. (Patient not taking: Reported on 07/07/2021) 237 mL 12   ondansetron (ZOFRAN-ODT) 4 MG  disintegrating tablet PUT 1 TABLET UNDER TOUNGE 3 TIMES A DAY AS NEEDED FOR NAUSEA (Patient not taking: Reported on 07/07/2021)  0   No facility-administered medications prior to visit.     Allergies:   Patient has no known allergies.   Social History   Socioeconomic History   Marital status: Married    Spouse name: Not on file   Number of children: 2   Years of education: Not on file   Highest education level: Not on file  Occupational History   Occupation: ACCOUNTANT  Tobacco Use   Smoking status: Former    Packs/day: 2.50    Types: Cigarettes    Quit date: 02/20/2000    Years since quitting: 21.4   Smokeless tobacco: Never  Substance and Sexual Activity   Alcohol use: Yes    Comment: 3 beers a day   Drug use: No   Sexual activity: Not on file  Other Topics Concern   Not on file  Social History Narrative   Lives in [place] with spouse.  2 daughters.   Social Determinants of Health   Financial Resource Strain: Not on file  Food Insecurity: Not on file  Transportation Needs: Not on file  Physical Activity: Not on file  Stress: Not on file  Social Connections: Not on file    Socially he was born in [place].  He is married for 50 years and has 2 children and 6 grandchildren.  He retired at age 29.  He previously had worked for [employer]  There is remote tobacco history and quit tobacco in 2001.  He exercises regularly doing treadmill, crosstraining, swimming at least 3 days/week in excess of 30 minutes.  Family History:  The patient's family history includes CVA (age of onset: 30) in his mother; Colon cancer in his father; Diabetes in his maternal grandmother; Dysrhythmia in his brother; GER disease in his sister; Heart attack in his father; Heart attack (age of onset: 77) in his mother; Hyperlipidemia in his sister; Hypertension in his brother and sister; Pneumonia in his father; Stroke (age of onset: 51) in his brother.   His mother died at age 65 and had CAD and elevated lipids.  Father died at age 47 as result of complications of colon cancer.  He has 1 brother who is deceased at age 18 and had a ruptured aortic aneurysm.  He has 5 living brothers who have histories of hypertension and hyperlipidemia.  He has 5 sisters with hypertension as well as thyroid disease.  ROS General: Negative; No fevers, chills, or night sweats;  HEENT: Negative; No changes in vision or hearing, sinus congestion, difficulty swallowing Pulmonary: Negative;  No cough, wheezing, shortness of breath, hemoptysis Cardiovascular: See HPI GI: History of GERD GU: Negative; No dysuria, hematuria, or difficulty voiding Musculoskeletal: Negative; no myalgias, joint pain, or weakness Hematologic/Oncology: Negative; no easy bruising, bleeding Endocrine: Negative; no heat/cold intolerance; no diabetes Neuro: Negative; no changes in balance, headaches Skin: Negative; No rashes or skin lesions Psychiatric: Negative; No behavioral problems, depression Sleep: OSA on CPAP followed by Dr. Elenore Rota; unaware of breakthrough snoring, daytime sleepiness, hypersomnolence, bruxism, restless legs, hypnogognic hallucinations, no cataplexy  An Epworth Sleepiness Scale score was calculated in the office today and this endorsed at 3 arguing against residual daytime sleepiness.  Other comprehensive 14 point system review is negative.   PHYSICAL EXAM:   VS:  BP (!) 146/66   Pulse (!) 54   Ht 5' 11"  (1.803 m)   Wt 168 lb (76.2 kg)   SpO2 98%   BMI 23.43 kg/m     Repeat blood pressure by me was 140/68  Wt Readings from Last 3 Encounters:  07/07/21 168 lb (76.2 kg)  07/01/21 165 lb (74.8 kg)  09/16/17 169 lb 15.6 oz (77.1 kg)    General: Alert, oriented, no distress.  Skin: normal turgor, no rashes, warm and dry HEENT: Normocephalic, atraumatic. Pupils equal round and reactive to light; sclera anicteric; extraocular muscles intact; Nose without nasal  septal hypertrophy Mouth/Parynx benign; Mallinpatti scale 3 Neck: No JVD, no carotid bruits; normal carotid upstroke Lungs: clear to ausculatation and percussion; no wheezing or rales Chest wall: without tenderness to palpitation Heart: PMI not displaced, RRR, s1 s2 normal, 8-5/9 systolic murmur, no diastolic murmur, no rubs, gallops, thrills, or heaves Abdomen: soft, nontender; no hepatosplenomehaly, BS+; abdominal aorta nontender and not dilated by palpation. Back: no CVA tenderness Pulses 2+ Musculoskeletal: full range of motion, normal strength, no joint deformities Extremities: no clubbing cyanosis or edema, Homan's sign negative  Neurologic: grossly nonfocal; Cranial nerves grossly wnl Psychologic: Normal mood and affect   Studies/Labs Reviewed:   Jul 07, 2021 ECG (independently read by me): Sinus bradycardia at 54, RBBB; PR interval normal at 126 ms  I personally reviewed the ECG from [facility] ER from Jul 01, 2021 which showed sinus bradycardia with short PR at 110 ms, right bundle branch block.  No ectopy.  Recent Labs:    Latest Ref Rng & Units 07/01/2021    9:57 AM 09/18/2017    4:12 AM 09/17/2017    4:13 AM  BMP  Glucose 70 - 99 mg/dL 109   103   99    BUN 8 - 23 mg/dL 19   10   19     Creatinine 0.61 - 1.24 mg/dL 0.93   0.84   0.94    Sodium 135 - 145 mmol/L 136   137   138    Potassium 3.5 - 5.1 mmol/L 4.3   3.6   3.7    Chloride 98 - 111 mmol/L 106   103   102    CO2 22 - 32 mmol/L 26   27   30     Calcium 8.9 - 10.3 mg/dL 9.1   8.6   8.6          Latest Ref Rng & Units 09/15/2017    4:28 PM 12/20/2014    2:01 PM 06/17/2007   11:35 AM  Hepatic Function  Total Protein 6.5 - 8.1 g/dL 7.7   7.6   6.6    Albumin 3.5 - 5.0 g/dL 4.1   4.1   3.6  AST 15 - 41 U/L 27   22   32    ALT 0 - 44 U/L 31   18   14     Alk Phosphatase 38 - 126 U/L 40   43   32    Total Bilirubin 0.3 - 1.2 mg/dL 1.6   0.8   1.2 HEMOLYZED SPECIMEN, RESULTS MAY BE AFFECTED    Bilirubin, Direct     0.5         Latest Ref Rng & Units 07/01/2021    9:57 AM 09/18/2017    4:12 AM 09/16/2017    4:13 AM  CBC  WBC 4.0 - 10.5 K/uL 5.3   7.7   6.5    Hemoglobin 13.0 - 17.0 g/dL 13.3   12.4   13.5    Hematocrit 39.0 - 52.0 % 40.8   36.5   39.3    Platelets 150 - 400 K/uL 249   229   237     Lab Results  Component Value Date   MCV 91.7 07/01/2021   MCV 89.5 09/18/2017   MCV 88.1 09/16/2017   Lab Results  Component Value Date   TSH 2.460 07/10/2021   No results found for: HGBA1C   BNP No results found for: BNP  ProBNP No results found for: PROBNP   Lipid Panel     Component Value Date/Time   CHOL 124 07/10/2021 1031   TRIG 33 07/10/2021 1031   HDL 55 07/10/2021 1031   CHOLHDL 2.3 07/10/2021 1031   LDLCALC 60 07/10/2021 1031   LABVLDL 9 07/10/2021 1031     RADIOLOGY: DG Chest 2 View  Result Date: 07/01/2021 CLINICAL DATA:  Chest pain EXAM: CHEST - 2 VIEW COMPARISON:  12/28/2014 FINDINGS: The cardiac silhouette, mediastinal and hilar contours are within normal limits. The lungs are clear of an acute process. No pleural effusions or pulmonary lesions. Bilateral nipple shadows are noted incidentally. The bony thorax is intact. IMPRESSION: No acute cardiopulmonary findings. Electronically Signed   By: Marijo Sanes M.D.   On: 07/01/2021 10:30   ECHOCARDIOGRAM COMPLETE  Result Date: 07/14/2021    ECHOCARDIOGRAM REPORT   Patient Name:   David Vuncannon. Date of Exam: 07/14/2021 Medical Rec #:  [id number]            Height:       71.0 in Accession #:    [id number]           Weight:       168.0 lb Date of Birth:  1950/06/01            BSA:          1.958 m Patient Age:    68 years             BP:           146/66 mmHg Patient Gender: M                    HR:           62 bpm. Exam Location:  [place] Procedure: 2D Echo, 3D Echo, Cardiac Doppler, Color Doppler and Strain Analysis Indications:    R06.02 Shortness of breath  History:        Patient has prior history of  Echocardiogram examinations, most                 recent 07/10/2014. Arrythmias:SVT, Signs/Symptoms:Shortness of  Breath and Syncope; Risk Factors:Former Smoker.  Sonographer:    Basilia Jumbo BS, RDCS Referring Phys: [place]  1. Left ventricular ejection fraction, by estimation, is 60 to 65%. The left ventricle has normal function. The left ventricle has no regional wall motion abnormalities. Left ventricular diastolic parameters were normal. The average left ventricular global longitudinal strain is -23.8 %. The global longitudinal strain is normal.  2. Right ventricular systolic function is normal. The right ventricular size is normal. There is normal pulmonary artery systolic pressure. The estimated right ventricular systolic pressure is 61.6 mmHg.  3. The mitral valve is grossly normal. Trivial mitral valve regurgitation. No evidence of mitral stenosis.  4. The aortic valve is tricuspid. Aortic valve regurgitation is not visualized. No aortic stenosis is present.  5. The inferior vena cava is normal in size with greater than 50% respiratory variability, suggesting right atrial pressure of 3 mmHg. Comparison(s): Report only from [facility] 07/10/14 EF 55-60%. FINDINGS  Left Ventricle: Left ventricular ejection fraction, by estimation, is 60 to 65%. The left ventricle has normal function. The left ventricle has no regional wall motion abnormalities. The average left ventricular global longitudinal strain is -23.8 %. The global longitudinal strain is normal. The left ventricular internal cavity size was normal in size. There is no left ventricular hypertrophy. Left ventricular diastolic parameters were normal. Right Ventricle: The right ventricular size is normal. No increase in right ventricular wall thickness. Right ventricular systolic function is normal. There is normal pulmonary artery systolic pressure. The tricuspid regurgitant velocity is 2.47 m/s, and  with an  assumed right atrial pressure of 3 mmHg, the estimated right ventricular systolic pressure is 83.7 mmHg. Left Atrium: Left atrial size was normal in size. Right Atrium: Right atrial size was normal in size. Pericardium: There is no evidence of pericardial effusion. Mitral Valve: The mitral valve is grossly normal. Trivial mitral valve regurgitation. No evidence of mitral valve stenosis. Tricuspid Valve: The tricuspid valve is grossly normal. Tricuspid valve regurgitation is mild . No evidence of tricuspid stenosis. Aortic Valve: The aortic valve is tricuspid. Aortic valve regurgitation is not visualized. No aortic stenosis is present. Pulmonic Valve: The pulmonic valve was grossly normal. Pulmonic valve regurgitation is trivial. No evidence of pulmonic stenosis. Aorta: The aortic root and ascending aorta are structurally normal, with no evidence of dilitation. Venous: The right lower pulmonary vein is normal. The inferior vena cava is normal in size with greater than 50% respiratory variability, suggesting right atrial pressure of 3 mmHg. IAS/Shunts: The atrial septum is grossly normal.  LEFT VENTRICLE PLAX 2D LVIDd:         5.20 cm   Diastology LVIDs:         3.70 cm   LV e' medial:    11.10 cm/s LV PW:         1.00 cm   LV E/e' medial:  6.2 LV IVS:        0.70 cm   LV e' lateral:   15.20 cm/s LVOT diam:     2.50 cm   LV E/e' lateral: 4.5 LV SV:         115 LV SV Index:   59        2D Longitudinal Strain LVOT Area:     4.91 cm  2D Strain GLS (A2C):   -20.5 %  2D Strain GLS (A3C):   -23.0 %                          2D Strain GLS (A4C):   -27.8 %                          2D Strain GLS Avg:     -23.8 %                           3D Volume EF:                          3D EF:        65 %                          LV EDV:       143 ml                          LV ESV:       50 ml                          LV SV:        93 ml RIGHT VENTRICLE             IVC RV Basal diam:  3.20 cm     IVC diam: 1.90  cm RV S prime:     16.20 cm/s TAPSE (M-mode): 2.8 cm RVSP:           27.4 mmHg LEFT ATRIUM             Index        RIGHT ATRIUM           Index LA diam:        4.10 cm 2.09 cm/m   RA Pressure: 3.00 mmHg LA Vol (A2C):   51.3 ml 26.20 ml/m  RA Area:     18.70 cm LA Vol (A4C):   36.9 ml 18.85 ml/m  RA Volume:   52.70 ml  26.92 ml/m LA Biplane Vol: 42.9 ml 21.91 ml/m  AORTIC VALVE LVOT Vmax:   122.00 cm/s LVOT Vmean:  75.900 cm/s LVOT VTI:    0.234 m  AORTA Ao Root diam: 3.30 cm Ao Asc diam:  2.90 cm MITRAL VALVE               TRICUSPID VALVE                            TR Peak grad:   24.4 mmHg MV Decel Time: 236 msec    TR Vmax:        247.00 cm/s MV E velocity: 68.70 cm/s  Estimated RAP:  3.00 mmHg MV A velocity: 65.40 cm/s  RVSP:           27.4 mmHg MV E/A ratio:  1.05                            SHUNTS                            Systemic VTI:  0.23  m                            Systemic Diam: 2.50 cm Eleonore Chiquito MD Electronically signed by Eleonore Chiquito MD Signature Date/Time: 07/14/2021/4:17:14 PM    Final      Additional studies/ records that were reviewed today include:   I have reviewed the records from [facility] ER, his ECG, and records from [place] where he has seen Dr. Gershon Crane   ASSESSMENT:    1. Chest discomfort   2. Lightheadedness   3. SVT (supraventricular tachycardia) ([place])   4. Systolic murmur   5. Right bundle branch block   6. OSA on CPAP      PLAN:  David [name], Brooke Bonito. 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  He has a history of PSVT which apparently was documented in 15 to 20 years ago which has remained fairly stable on his current dose of long-acting diltiazem 180 mg twice a day.  He also has a history of mild hyperlipidemia on rosuvastatin 10 mg and GERD on Nexium.  He has been on CPAP therapy for obstructive sleep apnea since 2009 currently followed by Dr.  Elenore Rota.  He had recently noticed some very mild lightheadedness for 4 to 6 weeks.  On Jul 01, 2021, he developed left arm numbness with vague chest pressure which seemed to be slightly better with deep breathing.  He ultimately presented to [facility] emergency room on Jul 01, 2021 but due to prolonged waiting time ultimately he left without being seen.  ECG at that time showed sinus rhythm with borderline short PR interval and new right bundle branch block.  When seen by Dr. Rockwell Germany subsequent to his ER evaluation cardiology evaluation was advised.  Presently, he has not had any recurrent symptomatology.  He exercises fairly regularly both doing treadmill, crosstraining, and elliptical work without symptomatology.  He denies any exertional dyspnea.  He does have a 1/6 to 2/6 systolic murmur.  I am recommending a 2D echo Doppler study for assessment of systolic, diastolic function and valvular architecture.  I reviewed laboratory from his ER evaluation where troponin was normal at 6.  Chemistry revealed mild glucose elevation at 109.  CBC was stable.  I have recommended follow-up fasting lipid panel, TSH, and will also check an LP(a) which is an independent risk factor above and beyond LDL.  With his recent chest discomfort, bundle branch block, I have recommended coronary CTA evaluation for assessment of calcium score and potential luminal coronary obstructive disease.  I will contact him regarding his results and will see him in follow-up in 1 month for further evaluation.   Medication Adjustments/Labs and Tests Ordered: Current medicines are reviewed at length with the patient today.  Concerns regarding medicines are outlined above.  Medication changes, Labs and Tests ordered today are listed in the Patient Instructions below. Patient Instructions  Medication Instructions:  Continue same medications   Lab Work: Lipid panel,LPa,tsh    Testing/Procedures: Echo  Coronary CT   will be scheduled after  approved by insurance    Follow instructions below   Follow-Up: At [facility], you and your health needs are our priority.  As part of our continuing mission to provide you with exceptional heart care, we have created designated Provider Care Teams.  These Care Teams include your primary Cardiologist (physician) and Advanced Practice Providers (  APPs -  Physician Assistants and Nurse Practitioners) who all work together to provide you with the care you need, when you need it.  We recommend signing up for the patient portal called "MyChart".  Sign up information is provided on this After Visit Summary.  MyChart is used to connect with patients for Virtual Visits (Telemedicine).  Patients are able to view lab/test results, encounter notes, upcoming appointments, etc.  Non-urgent messages can be sent to your provider as well.   To learn more about what you can do with MyChart, go to [email or web address].    Your next appointment:  The end of June    The format for your next appointment: Office   Provider:  [facility]       Your cardiac CT will be scheduled at one of the below locations:   [facility] [address] [place], [place] [address] [phone]  [place] [address] [place], [place] [address] [phone]  If scheduled at [facility], please arrive at the [facility] and Children's Entrance (Entrance C2) of [facility] 30 minutes prior to test start time. You can use the FREE valet parking offered at entrance C (encouraged to control the heart rate for the test)  Proceed to the [facility] Radiology Department (first floor) to check-in and test prep.  All radiology patients and guests should use entrance C2 at [facility], accessed from [facility], even though the hospital's physical address listed is [address]    If scheduled at [facility], please arrive 15 mins early for check-in and test prep.  Please follow these instructions carefully (unless otherwise directed):  Hold all erectile dysfunction medications at least 3 days (72 hrs) prior to test.  On the Night Before the Test: Be sure to Drink plenty of water. Do not consume any caffeinated/decaffeinated beverages or chocolate 12 hours prior to your test. Do not take any antihistamines 12 hours prior to your test.   On the Day of the Test: Drink plenty of water until 1 hour prior to the test. Do not eat any food 4 hours prior to the test. You may take your regular medications prior to the test.  Take metoprolol 25 mg. two hours prior to test.         After the Test: Drink plenty of water. After receiving IV contrast, you may experience a mild flushed feeling. This is normal. On occasion, you may experience a mild rash up to 24 hours after the test. This is not dangerous. If this occurs, you can take Benadryl 25 mg and increase your fluid intake. If you experience trouble breathing, this can be serious. If it is severe call 911 IMMEDIATELY. If it is mild, please call our office.  We will call to schedule your test 2-4 weeks out understanding that some insurance companies will need an authorization prior to the service being performed.   For non-scheduling related questions, please contact the cardiac imaging nurse navigator should you have any questions/concerns: Marchia Bond, Cardiac Imaging Nurse Navigator Gordy Clement, Cardiac Imaging Nurse Navigator [facility] Direct Office Dial: [phone]   For scheduling needs, including cancellations and rescheduling, please call [place], [phone].   Important Information About Sugar         Signed, Shelva Majestic, MD  07/16/2021 10:00 AM    [place] [address]  [place], [place], [place]  [address] Phone: [phone]

## 2021-07-10 DIAGNOSIS — I471 Supraventricular tachycardia: Secondary | ICD-10-CM | POA: Diagnosis not present

## 2021-07-10 DIAGNOSIS — R0789 Other chest pain: Secondary | ICD-10-CM | POA: Diagnosis not present

## 2021-07-10 DIAGNOSIS — R0602 Shortness of breath: Secondary | ICD-10-CM | POA: Diagnosis not present

## 2021-07-10 DIAGNOSIS — R072 Precordial pain: Secondary | ICD-10-CM | POA: Diagnosis not present

## 2021-07-11 LAB — LIPID PANEL
Chol/HDL Ratio: 2.3 ratio (ref 0.0–5.0)
Cholesterol, Total: 124 mg/dL (ref 100–199)
HDL: 55 mg/dL (ref >39)
LDL Chol Calc (NIH): 60 mg/dL (ref 0–99)
Triglycerides: 33 mg/dL (ref 0–149)
VLDL Cholesterol Cal: 9 mg/dL (ref 5–40)

## 2021-07-11 LAB — LIPOPROTEIN A (LPA): Lipoprotein (a): 39.7 nmol/L (ref <75.0)

## 2021-07-11 LAB — TSH: TSH: 2.46 u[IU]/mL (ref 0.450–4.500)

## 2021-07-14 ENCOUNTER — Ambulatory Visit (HOSPITAL_COMMUNITY): Payer: Medicare HMO | Attending: Cardiovascular Disease

## 2021-07-14 DIAGNOSIS — R0602 Shortness of breath: Secondary | ICD-10-CM | POA: Insufficient documentation

## 2021-07-14 DIAGNOSIS — R0789 Other chest pain: Secondary | ICD-10-CM | POA: Diagnosis not present

## 2021-07-14 LAB — ECHOCARDIOGRAM COMPLETE
Area-P 1/2: 3.21 cm2
S' Lateral: 3.7 cm

## 2021-07-16 ENCOUNTER — Encounter: Payer: Self-pay | Admitting: Cardiovascular Disease

## 2021-08-16 ENCOUNTER — Telehealth (HOSPITAL_COMMUNITY): Payer: Self-pay | Admitting: *Deleted

## 2021-08-16 NOTE — Telephone Encounter (Signed)
Reaching out to patient to offer assistance regarding upcoming cardiac imaging study; pt verbalizes understanding of appt date/time, parking situation and where to check in, pre-test NPO status and verified current allergies; name and call back number provided for further questions should they arise  Merrie Epler RN Navigator Cardiac Imaging [facility] [phone] office [phone] cell  Patient aware to arrive at 11am. 

## 2021-08-17 ENCOUNTER — Ambulatory Visit (HOSPITAL_COMMUNITY)
Admission: RE | Admit: 2021-08-17 | Discharge: 2021-08-17 | Disposition: A | Discharge status: Home / Self Care | Payer: Medicare HMO | Source: Ambulatory Visit | Attending: Cardiovascular Disease | Admitting: Cardiovascular Disease

## 2021-08-17 DIAGNOSIS — I700 Atherosclerosis of aorta: Secondary | ICD-10-CM | POA: Insufficient documentation

## 2021-08-17 DIAGNOSIS — I2510 Atherosclerotic heart disease of native coronary artery without angina pectoris: Secondary | ICD-10-CM

## 2021-08-17 DIAGNOSIS — R0789 Other chest pain: Secondary | ICD-10-CM | POA: Diagnosis not present

## 2021-08-17 MED ORDER — IOHEXOL 350 MG/ML SOLN
100.0000 mL | Freq: Once | INTRAVENOUS | Status: AC | PRN
Start: 2021-08-17 — End: 2021-08-17
  Administered 2021-08-17: 100 mL via INTRAVENOUS

## 2021-08-17 MED ORDER — NITROGLYCERIN 0.4 MG SL SUBL
SUBLINGUAL_TABLET | SUBLINGUAL | Status: AC
  Filled 2021-08-17: qty 2

## 2021-08-17 MED ORDER — NITROGLYCERIN 0.4 MG SL SUBL
0.8000 mg | SUBLINGUAL_TABLET | Freq: Once | SUBLINGUAL | Status: AC
  Administered 2021-08-17: 0.8 mg via SUBLINGUAL

## 2021-08-21 ENCOUNTER — Telehealth: Payer: Self-pay

## 2021-08-21 ENCOUNTER — Other Ambulatory Visit: Payer: Self-pay

## 2021-08-21 MED ORDER — ROSUVASTATIN CALCIUM 20 MG PO TABS: 20.0000 mg | ORAL_TABLET | Freq: Every day | ORAL | 3 refills | Status: DC

## 2021-08-21 NOTE — Telephone Encounter (Signed)
-  Called pt regarding Cardiac CTA results -Pt stated on 6/24 he started experiencing visual disturbance. He report it originally started in left eye then progressed to both. He explained it as seeing smooth ripples on a lake. -He report prior he was very nauseated then experienced labored breathing and pressure across his chest.  -He stated his wife said he turned pale -He report symptoms lasted roughly 10 mins and denies symptoms currently   Pt advised to keep scheduled appointment on 6/19 but report to ER if symptoms reoccur. Pt verbalized understanding.

## 2021-08-23 NOTE — Telephone Encounter (Signed)
Agree with recommendation

## 2021-08-25 DIAGNOSIS — Z6823 Body mass index (BMI) 23.0-23.9, adult: Secondary | ICD-10-CM | POA: Diagnosis not present

## 2021-08-25 DIAGNOSIS — H6591 Unspecified nonsuppurative otitis media, right ear: Secondary | ICD-10-CM | POA: Diagnosis not present

## 2021-09-06 ENCOUNTER — Ambulatory Visit: Payer: Medicare HMO | Admitting: Cardiovascular Disease

## 2021-09-06 ENCOUNTER — Encounter: Payer: Self-pay | Admitting: Cardiovascular Disease

## 2021-09-06 DIAGNOSIS — G4733 Obstructive sleep apnea (adult) (pediatric): Secondary | ICD-10-CM | POA: Diagnosis not present

## 2021-09-06 DIAGNOSIS — R55 Syncope and collapse: Secondary | ICD-10-CM | POA: Diagnosis not present

## 2021-09-06 DIAGNOSIS — I4510 Unspecified right bundle-branch block: Secondary | ICD-10-CM | POA: Diagnosis not present

## 2021-09-06 DIAGNOSIS — I471 Supraventricular tachycardia: Secondary | ICD-10-CM

## 2021-09-06 DIAGNOSIS — H579 Unspecified disorder of eye and adnexa: Secondary | ICD-10-CM

## 2021-09-06 DIAGNOSIS — Z9989 Dependence on other enabling machines and devices: Secondary | ICD-10-CM | POA: Diagnosis not present

## 2021-09-06 DIAGNOSIS — R0789 Other chest pain: Secondary | ICD-10-CM

## 2021-09-06 DIAGNOSIS — R931 Abnormal findings on diagnostic imaging of heart and coronary circulation: Secondary | ICD-10-CM | POA: Diagnosis not present

## 2021-09-06 MED ORDER — ROSUVASTATIN CALCIUM 20 MG PO TABS
20.0000 mg | ORAL_TABLET | Freq: Every day | ORAL | 3 refills | Status: AC
Start: 2021-09-06 — End: ?

## 2021-09-06 NOTE — Progress Notes (Signed)
Cardiology Office Note    Date:  09/12/2021   ID:  David Puccinelli., DOB 09/06/1950, MRN [id number]  PCP:  David Dus, MD  Cardiologist:  David Majestic, MD   2 month F/U cardiology duration, initially referred through [facility] and [facility] following the ER evaluation   History of Present Illness:  David Polidori. is a 71 y.o. male who is followed by David [name]  at [place].  I saw him for my initial cardiology evaluation on Jul 07, 2021 and he presents for 46-monthfollow-up evaluation.  Mr. 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  He has a history of obstructive sleep apnea since 2009 and is on CPAP therapy.  Over the past 4 to 6 weeks, he has noticed some very mild lightheadedness.  He states he typically exercises and does treadmill, cross training and an elliptical exercise.  On Saturday, May 13 he developed left arm numbness and some slight chest pressure.  Due to persistence of this discomfort [facility]  He apparently had waited for over 4 hours and was about to be brought back but then there was taken and he was put back in the waiting room.  He waited an additional hour and a half and apparently was no longer on the list to be seen and ultimately left without being seen.  He was seen on May 18 by Dr. [place]well [place].  [facility] bradycardia 59 bpm, PR interval was slightly short at 110 ms.  There is no evidence for preexcitation.  Right bundle branch block was present which apparently was new.  When I saw him for my initial evaluation at that time he denied recurrent chest or arm discomfort.  He denies any syncope and states his PSVT has been controlled on diltiazem.  He continues to use CPAP.  He has been on rosuvastatin 10 mg for hyperlipidemia, and  Nexium for GERD.  During that evaluation, he had a 1/6 to 2/6 systolic murmur.  I recommended he undergo a 2D echo Doppler study for further assessment of LV function and valvular architecture.  I recommended follow-up lipid panel and also that we check an LP(a) for further assessment.  With his chest discomfort and left bundle branch block I recommended coronary CTA evaluation for assessment of calcium score and potential luminal coronary obstructive disease.  Presently, David [name].  He is in the office today with his wife.  He has not had any further episodes of chest pain.  His echo Doppler study done on Jul 14, 2021 showed EF at 60 to 65% without wall motion abnormalities.  He had normal strain pattern.  Valves were essentially normal.  Coronary CTA evaluation on August 17, 2021 showed a calcium score of 407 Agatston units, representing 69th percentile for age, sex and race matched control.  There was moderate 60 to 69% ostial stenosis and a diagonal branch of the LAD. He had mild plaque of 25 to 49% calcified plaque in the proximal LAD, ostial diagonal 2, mid circumflex, and proximal OM 2.  There was aortic atherosclerosis.  Due to severe misregistration, FFR could not be performed.    Presently, David [name] with reference to chest pain or  shortness of breath.  He has noticed some vague right visual symptoms and at a time he became pale and noticed his blood pressure had increased to 169/88.  This was transient.  He is now on rosuvastatin 20 mg.  LP(a) was normal at 39.7.  Subsequent laboratory as shown LDL cholesterol now at 60 on rosuvastatin 20 mg daily.  TSH was normal at 2.46.  He continues to use CPAP, currently followed by Dr. Elenore [name] who will be retiring at the end of the year.  He presents for follow-up evaluation.  Past Medical History:  Diagnosis Date   Back pain, chronic    CAD (coronary artery disease) 2009   nonobstructive by cath   Chronic knee pain     Chronic low back pain    Dysrhythmia    GERD (gastroesophageal reflux disease)    History of PSVT (paroxysmal supraventricular tachycardia) 2009   nonsustained atach per Dr Marlou Porch on monitor in 2009. treated with diltiazem   Hyperlipidemia    INTOLERANT OF STATINS   Hypertension    Knee pain, right    PARTICULARY AT NIGHT   Near syncope 07/23/2014   OSA (obstructive sleep apnea)    BIPAP   Palpitations     Past Surgical History:  Procedure Laterality Date   ANTERIOR CERVICAL DECOMPRESSION/DISCECTOMY FUSION 4 LEVELS N/A 12/22/2014   Procedure: ANTERIOR CERVICAL DECOMPRESSION/DISCECTOMY FUSION 4 LEVELS;  Surgeon: Phylliss Bob, MD;  Location: [place];  Service: Orthopedics;  Laterality: N/A;  Anteriro cervical decompression fusion, cervical 3-4, cervical 4-5, cervical 5-6, cervical 6-7 with instrumentation and allograft   CHOLECYSTECTOMY     COLONOSCOPY     2003, 2008, 2013   CORONARY ANGIOPLASTY  07/2007   NONOBSTRUCTIVE CORONARY PLAQUING- MINOR 20 % OSTIAL DIAGONAL STENOSIS, NORMAL LEFT VENTRICULAR EF 55%   ERCP  2006   KNEE ARTHROSCOPY Right 1982   LAPAROSCOPIC CHOLECYSTECTOMY  2006   VASECTOMY  1980    Current Medications: Outpatient Medications Prior to Visit  Medication Sig Dispense Refill   aspirin EC 81 MG tablet Take 81 mg by mouth daily.     diltiazem (DILACOR XR) 180 MG 24 hr capsule Take 180 mg by mouth 2 (two) times daily.      esomeprazole (NEXIUM) 40 MG capsule Take 40 mg by mouth daily.      Multiple Vitamins-Minerals (MULTIVITAMIN WITH MINERALS) tablet Take 1 tablet by mouth daily.     SHINGRIX injection      rosuvastatin (CRESTOR) 20 MG tablet Take 1 tablet (20 mg total) by mouth at bedtime. 90 tablet 3   metoprolol tartrate (LOPRESSOR) 25 MG tablet Take 25 mg  2 hours before Coronary CT (Patient not taking: Reported on 09/06/2021) 1 tablet 0   No facility-administered medications prior to visit.     Allergies:   Patient has no known allergies.   Social History    Socioeconomic History   Marital status: Married    Spouse name: Not on file   Number of children: 2   Years of education: Not on file   Highest education level: Not on file  Occupational History   Occupation: ACCOUNTANT  Tobacco Use   Smoking status: Former    Packs/day: 1.00    Types: Cigarettes    Quit date: 02/20/2000    Years since quitting: 21.5   Smokeless tobacco: Never  Substance and Sexual Activity   Alcohol use: Yes    Comment: 3 beers a day   Drug use: No   Sexual  activity: Not on file  Other Topics Concern   Not on file  Social History Narrative   Lives in [place] with spouse.  2 daughters.   Social Determinants of Health   Financial Resource Strain: Not on file  Food Insecurity: Not on file  Transportation Needs: Not on file  Physical Activity: Not on file  Stress: Not on file  Social Connections: Not on file    Socially he was born in [place].  He is married for 50 years and has 2 children and 6 grandchildren.  He retired at age 20.  He previously had worked for [employer]  There is remote tobacco history and quit tobacco in 2001.  He exercises regularly doing treadmill, crosstraining, swimming at least 3 days/week in excess of 30 minutes.  Family History:  The patient's family history includes CVA (age of onset: 52) in his mother; Colon cancer in his father; Diabetes in his maternal grandmother; Dysrhythmia in his brother; GER disease in his sister; Heart attack in his father; Heart attack (age of onset: 86) in his mother; Hyperlipidemia in his sister; Hypertension in his brother and sister; Pneumonia in his father; Stroke (age of onset: 93) in his brother.  His mother died at age 19 and had CAD and elevated lipids.  Father died at age 74 as result of complications of colon cancer.  He has 1 brother who is deceased at age 32 and had a ruptured aortic aneurysm.  He has 5 living brothers who have histories of  hypertension and hyperlipidemia.  He has 5 sisters with hypertension as [name] as thyroid disease.  ROS General: Negative; No fevers, chills, or night sweats;  HEENT: Negative; No changes in vision or hearing, sinus congestion, difficulty swallowing Pulmonary: Negative; No cough, wheezing, shortness of breath, hemoptysis Cardiovascular: See HPI GI: History of GERD GU: Negative; No dysuria, hematuria, or difficulty voiding Musculoskeletal: Negative; no myalgias, joint pain, or weakness Hematologic/Oncology: Negative; no easy bruising, bleeding Endocrine: Negative; no heat/cold intolerance; no diabetes Neuro: Negative; no changes in balance, headaches Skin: Negative; No rashes or skin lesions Psychiatric: Negative; No behavioral problems, depression Sleep: OSA on CPAP followed by Dr. Elenore [name]; unaware of breakthrough snoring, daytime sleepiness, hypersomnolence, bruxism, restless legs, hypnogognic hallucinations, no cataplexy  An Epworth Sleepiness Scale score was calculated in the office today and this endorsed at 3 arguing against residual daytime sleepiness.  Other comprehensive 14 point system review is negative.   PHYSICAL EXAM:   VS:  BP 136/60   Pulse 61   Ht 5' 11.5" (1.816 m)   Wt 174 lb (78.9 kg)   SpO2 98%   BMI 23.93 kg/m     Repeat blood pressure by me was 124/68.  Wt Readings from Last 3 Encounters:  09/06/21 174 lb (78.9 kg)  07/07/21 168 lb (76.2 kg)  07/01/21 165 lb (74.8 kg)    General: Alert, oriented, no distress.  Skin: normal turgor, no rashes, warm and dry HEENT: Normocephalic, atraumatic. Pupils equal round and reactive to light; sclera anicteric; extraocular muscles intact;  Nose without nasal septal hypertrophy Mouth/Parynx benign; Mallinpatti scale 3 Neck: No JVD, no carotid bruits; normal carotid upstroke Lungs: clear to ausculatation and percussion; no wheezing or rales Chest wall: without tenderness to palpitation Heart: PMI not displaced, RRR, s1  s2 normal, 1-7/0 systolic murmur, no diastolic murmur, no rubs, gallops, thrills, or heaves Abdomen: soft, nontender; no hepatosplenomehaly, BS+; abdominal aorta nontender and not dilated by palpation. Back: no  CVA tenderness Pulses 2+ Musculoskeletal: full range of motion, normal strength, no joint deformities Extremities: no clubbing cyanosis or edema, Homan's sign negative  Neurologic: grossly nonfocal; Cranial nerves grossly wnl Psychologic: Normal mood and affect    Studies/Labs Reviewed:   September 06, 2021 ECG (independently read by me): NSR at 61, RBBB  Jul 07, 2021 ECG (independently read by me): Sinus bradycardia at 54, RBBB; PR interval normal at 126 ms  I personally reviewed the ECG from [facility] ER from Jul 01, 2021 which showed sinus bradycardia with short PR at 110 ms, right bundle branch block.  No ectopy.  Recent Labs:    Latest Ref Rng & Units 07/01/2021    9:57 AM 09/18/2017    4:12 AM 09/17/2017    4:13 AM  BMP  Glucose 70 - 99 mg/dL 109  103  99   BUN 8 - 23 mg/dL 19  10  19    Creatinine 0.61 - 1.24 mg/dL 0.93  0.84  0.94   Sodium 135 - 145 mmol/L 136  137  138   Potassium 3.5 - 5.1 mmol/L 4.3  3.6  3.7   Chloride 98 - 111 mmol/L 106  103  102   CO2 22 - 32 mmol/L 26  27  30    Calcium 8.9 - 10.3 mg/dL 9.1  8.6  8.6         Latest Ref Rng & Units 09/15/2017    4:28 PM 12/20/2014    2:01 PM 06/17/2007   11:35 AM  Hepatic Function  Total Protein 6.5 - 8.1 g/dL 7.7  7.6  6.6   Albumin 3.5 - 5.0 g/dL 4.1  4.1  3.6   AST 15 - 41 U/L 27  22  32   ALT 0 - 44 U/L 31  18  14    Alk Phosphatase 38 - 126 U/L 40  43  32   Total Bilirubin 0.3 - 1.2 mg/dL 1.6  0.8  1.2 HEMOLYZED SPECIMEN, RESULTS MAY BE AFFECTED   Bilirubin, Direct    0.5        Latest Ref Rng & Units 07/01/2021    9:57 AM 09/18/2017    4:12 AM 09/16/2017    4:13 AM  CBC  WBC 4.0 - 10.5 K/uL 5.3  7.7  6.5   Hemoglobin 13.0 - 17.0 g/dL 13.3  12.4  13.5   Hematocrit 39.0 - 52.0 % 40.8  36.5  39.3    Platelets 150 - 400 K/uL 249  229  237    Lab Results  Component Value Date   MCV 91.7 07/01/2021   MCV 89.5 09/18/2017   MCV 88.1 09/16/2017   Lab Results  Component Value Date   TSH 2.460 07/10/2021   No results found for: "HGBA1C"   BNP No results found for: "BNP"  ProBNP No results found for: "PROBNP"   Lipid Panel     Component Value Date/Time   CHOL 124 07/10/2021 1031   TRIG 33 07/10/2021 1031   HDL 55 07/10/2021 1031   CHOLHDL 2.3 07/10/2021 1031   LDLCALC 60 07/10/2021 1031   LABVLDL 9 07/10/2021 1031     RADIOLOGY: CT CORONARY MORPH W/CTA COR W/SCORE W/CA W/CM &/OR WO/CM  Addendum Date: 08/17/2021   ADDENDUM REPORT: 08/17/2021 13:41 EXAM: OVER-READ INTERPRETATION  CT CHEST The following report is an over-read performed by radiologist Dr. Nettie Elm [facility] on 08/17/2021. This over-read does not include interpretation of cardiac or coronary anatomy or pathology. The  coronary CTA interpretation by the cardiologist is attached. COMPARISON:  Chest x-ray dated Jul 01, 2021. FINDINGS: Vascular: There are no significant vascular findings. Mediastinum/Nodes: There are no enlarged lymph nodes.The visualized esophagus demonstrates no significant findings. Lungs/Pleura: Clear lungs. No pneumothorax or pleural effusion. Upper abdomen: No acute abnormality. Musculoskeletal/Chest wall: No chest wall abnormality. No acute or significant osseous findings. IMPRESSION: 1. No significant extracardiac findings. Electronically Signed   By: Titus Dubin M.D.   On: 08/17/2021 13:41   Result Date: 08/17/2021 CLINICAL DATA:  71 yo male with chest pain EXAM: Cardiac/Coronary CTA TECHNIQUE: A non-contrast, gated CT scan was obtained with axial slices of 3 mm through the heart for calcium scoring. Calcium scoring was performed using the Agatston method. A 120 kV prospective, gated, contrast cardiac scan was obtained. Gantry rotation speed was 250 msecs and collimation  was 0.6 mm. Two sublingual nitroglycerin tablets (0.8 mg) were given. The 3D data set was reconstructed in 5% intervals of the 35-75% of the R-R cycle. Diastolic phases were analyzed on a dedicated workstation using MPR, MIP, and VRT modes. The patient received 95 cc of contrast. FINDINGS: Image quality: Poor. Noise artifact is: Severe. (signal-to-noise (mis-registration noted in all 3 coronary arteries). Coronary Arteries:  Normal coronary origin.  Right dominance. Left main: The left main is a large caliber vessel with a normal take off from the left coronary cusp that bifurcates to form a left anterior descending artery and a left circumflex artery. There is no plaque or stenosis. Left anterior descending artery: The LAD has minimal (0-24) followed by mild (25-49) calcified stenosis in the proximal vessel; there is minimal (0-24) calcified stenosis in the mid vessel. The proximal LAD stenosis involve the ostium of D1 and D2. There is moderate (50-69) mixed plaque stenosis in the ostium of large D1 and mild (25-49) calcified stenosis in the ostium of D2. Left circumflex artery: The LCX is non-dominant with miminal (0-24) calcified plaque in the proximal vessel and mild (25-49) calcified plaque in the mid vessel; the vessel is small distally. The LCX gives off small OM1 and large OM2. There is mild calcified plaque in proximal OM2. Right coronary artery: The RCA is dominant with normal take off from the right coronary cusp. There is minimal (0-24) calcified plaque in the proximal vessel. The RCA gives off patent RV branch and terminates as a small PDA. Right Atrium: Right atrial size is within normal limits. Right Ventricle: The right ventricular cavity is within normal limits. Left Atrium: Left atrial size is normal in size with no left atrial appendage filling defect. Left Ventricle: The ventricular cavity size is within normal limits. There are no stigmata of prior infarction. There is no abnormal filling  defect. Pulmonary arteries: Normal in size without proximal filling defect. Pulmonary veins: Normal pulmonary venous drainage. Pericardium: Normal thickness with no significant effusion or calcium present. Cardiac valves: The aortic valve is trileaflet without significant calcification. The mitral valve is normal structure without significant calcification. Aorta: Normal caliber with aortic atherosclerosis. Extra-cardiac findings: See attached radiology report for non-cardiac structures. IMPRESSION: 1. Coronary calcium score of 407. This was 96 percentile for age-, sex, and race-matched controls. 2. Normal coronary origin with right dominance. 3. Moderate (50-69) ostial stenosis in D1 and mild (25-49) calcified plaque in proximal LAD, ostial D2, mid Lcx and proximal OM2. 4. Aortic atherosclerosis. 5. Due to severe misregistration, FFR could not be performed. RECOMMENDATIONS: CAD-RADS 3: Moderate stenosis. Consider symptom-guided anti-ischemic pharmacotherapy as [name] as risk factor modification  per guideline directed care. Additional analysis with CT FFR will be submitted. Kirk Ruths, MD Electronically Signed: By: Kirk Ruths M.D. On: 08/17/2021 13:01     Additional studies/ records that were reviewed today include:   I have reviewed the records from [facility] ER, his ECG, and records from [place] where he has seen Dr. Gershon Crane  ECHO: 07/14/2021  1. Left ventricular ejection fraction, by estimation, is 60 to 65%. The  left ventricle has normal function. The left ventricle has no regional  wall motion abnormalities. Left ventricular diastolic parameters were  normal. The average left ventricular  global longitudinal strain is -23.8 %. The global longitudinal strain is  normal.   2. Right ventricular systolic function is normal. The right ventricular  size is normal. There is normal pulmonary artery systolic pressure. The  estimated right ventricular systolic pressure is 96.2 mmHg.   3. The  mitral valve is grossly normal. Trivial mitral valve  regurgitation. No evidence of mitral stenosis.   4. The aortic valve is tricuspid. Aortic valve regurgitation is not  visualized. No aortic stenosis is present.   5. The inferior vena cava is normal in size with greater than 50%  respiratory variability, suggesting right atrial pressure of 3 mmHg.   Comparison(s): Report only from [facility] 07/10/14 EF 55-60%.    ASSESSMENT:    1. Agatston coronary artery calcium score greater than 400: 407 units   2. Chest discomfort   3. Near syncope   4. Right bundle branch block   5. SVT (supraventricular tachycardia) ([place])   6. OSA on CPAP   7. Visual symptoms: Transient right eye    PLAN:  David [name], David Bonito. 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  He has a history of PSVT which apparently was documented in 15 to 20 years ago which has remained fairly stable on his current dose of long-acting diltiazem 180 mg twice a day.  He also has a history of mild hyperlipidemia on rosuvastatin 10 mg and GERD on Nexium.  He has been on CPAP therapy for obstructive sleep apnea since 2009 currently followed by Dr. Elenore [name].  He had recently noticed some very mild lightheadedness for 4 to 6 weeks.  On Jul 01, 2021, he developed left arm numbness with vague chest pressure which seemed to be slightly better with deep breathing.  He ultimately presented to [facility] emergency room on Jul 01, 2021 but due to prolonged waiting time ultimately he left without being seen.  ECG at that time showed sinus rhythm with borderline short PR interval and new right bundle branch block.  When seen by Dr. Rockwell [name] subsequent to his ER evaluation cardiology evaluation was advised.  I saw him for my initial evaluation on Jul 07, 2021.  I recommended he undergo an echo Doppler study which revealed normal LV function with EF 60 to 65%  and normal diastolic function.  There was trivial mitral regurgitation.  He had a normal tricuspid valve.  He had normal wall motion.  Due to his symptomatology, coronary CTA was performed which demonstrated moderate coronary calcification with a calcium score of 407 representing 69th percentile for age, sex and race matched control.  He had moderate 50 to 69% ostial plaque in the first diagonal vessel and had mild 25 to 49% calcified plaque in the proximal LAD, ostial D2, mid circumflex and proximal OM 2 vessel.  Subsequently his dose  of rosuvastatin was increased to 20 mg.  LP(a) was normal.  A subsequent lipid study has shown improvement with LDL cholesterol now at 60, HDL 55 triglycerides 33 and total cholesterol 124.  He is [name] and has not experienced any chest pain or shortness of breath.  He has noticed some vague right visual symptoms associated with increased transient blood pressure.  I have recommended he undergo carotid duplex imaging for assessment of his carotid arteries.  He will be seeing Dr. Mariea Clonts for follow-up evaluation later this year and I recommend repeat laboratory be obtained at that time.  I will see him in 2 to 3 months following his evaluation with Dr. Mariea Clonts who will be retiring at the end of the year.  He continues to use CPAP with excellent compliance.  However I have recommended improved sleep duration since he typically averages only 5 and half hours of sleep per night.  He tells me Dr. Elenore [name] will be retiring at the end of the year as [name].    Presently, he has not had any recurrent symptomatology.  He exercises fairly regularly both doing treadmill, crosstraining, and elliptical work without symptomatology.  He denies any exertional dyspnea.  He does have a 1/6 to 2/6 systolic murmur.  I am recommending a 2D echo Doppler study for assessment of systolic, diastolic function and valvular architecture.  I reviewed laboratory from his ER evaluation where troponin was normal at  6.  Chemistry revealed mild glucose elevation at 109.  CBC was stable.  I have recommended follow-up fasting lipid panel, TSH, and will also check an LP(a) which is an independent risk factor above and beyond LDL.  With his recent chest discomfort, bundle branch block, I have recommended coronary CTA evaluation for assessment of calcium score and potential luminal coronary obstructive disease.  I will contact him regarding his results and will see him in follow-up in 1 month for further evaluation.   Medication Adjustments/Labs and Tests Ordered: Current medicines are reviewed at length with the patient today.  Concerns regarding medicines are outlined above.  Medication changes, Labs and Tests ordered today are listed in the Patient Instructions below. Patient Instructions  Medication Instructions:  No changes *If you need a refill on your cardiac medications before your next appointment, please call your pharmacy*   Lab Work: Labs at PCP If you have labs (blood work) drawn today and your tests are completely normal, you will receive your results only by: [place] (if you have MyChart) OR A paper copy in the mail If you have any lab test that is abnormal or we need to change your treatment, we will call you to review the results.   Testing/Procedures: Your physician has requested that you have a carotid duplex. This test is an ultrasound of the carotid arteries in your neck. It looks at blood flow through these arteries that supply the brain with blood. Allow one hour for this exam. There are no restrictions or special instructions. This will take place at [place], [address].  Follow-Up: At [facility], you and your health needs are our priority.  As part of our continuing mission to provide you with exceptional heart care, we have created designated Provider Care Teams.  These Care Teams include your primary Cardiologist (physician) and Advanced Practice Providers (APPs  -  Physician Assistants and Nurse Practitioners) who all work together to provide you with the care you need, when you need it.  We recommend signing up for  the patient portal called "MyChart".  Sign up information is provided on this After Visit Summary.  MyChart is used to connect with patients for Virtual Visits (Telemedicine).  Patients are able to view lab/test results, encounter notes, upcoming appointments, etc.  Non-urgent messages can be sent to your provider as [name].   To learn more about what you can do with MyChart, go to [email or web address].    Your next appointment:   9 month(s)  The format for your next appointment:   In Person  Provider:   Dr. Claiborne Billings  Important Information About Sugar         Signed, David Majestic, MD  09/12/2021 11:52 AM    [facility] [address] [place], [place], [place]  [address] Phone: [phone]

## 2021-09-06 NOTE — Patient Instructions (Signed)
Medication Instructions:  No changes *If you need a refill on your cardiac medications before your next appointment, please call your pharmacy*   Lab Work: Labs at PCP If you have labs (blood work) drawn today and your tests are completely normal, you will receive your results only by: [facility] (if you have MyChart) OR A paper copy in the mail If you have any lab test that is abnormal or we need to change your treatment, we will call you to review the results.   Testing/Procedures: Your physician has requested that you have a carotid duplex. This test is an ultrasound of the carotid arteries in your neck. It looks at blood flow through these arteries that supply the brain with blood. Allow one hour for this exam. There are no restrictions or special instructions. This will take place at [facility], [address].  Follow-Up: At [facility], you and your health needs are our priority.  As part of our continuing mission to provide you with exceptional heart care, we have created designated Provider Care Teams.  These Care Teams include your primary Cardiologist (physician) and Advanced Practice Providers (APPs -  Physician Assistants and Nurse Practitioners) who all work together to provide you with the care you need, when you need it.  We recommend signing up for the patient portal called "MyChart".  Sign up information is provided on this After Visit Summary.  MyChart is used to connect with patients for Virtual Visits (Telemedicine).  Patients are able to view lab/test results, encounter notes, upcoming appointments, etc.  Non-urgent messages can be sent to your provider as well.   To learn more about what you can do with MyChart, go to [email or web address].    Your next appointment:   9 month(s)  The format for your next appointment:   In Person  Provider:   Dr. Claiborne Billings  Important Information About [place]

## 2021-09-10 DIAGNOSIS — H7291 Unspecified perforation of tympanic membrane, right ear: Secondary | ICD-10-CM | POA: Diagnosis not present

## 2021-09-10 DIAGNOSIS — H6591 Unspecified nonsuppurative otitis media, right ear: Secondary | ICD-10-CM | POA: Diagnosis not present

## 2021-09-10 DIAGNOSIS — H6122 Impacted cerumen, left ear: Secondary | ICD-10-CM | POA: Diagnosis not present

## 2021-09-10 DIAGNOSIS — H60392 Other infective otitis externa, left ear: Secondary | ICD-10-CM | POA: Diagnosis not present

## 2021-09-12 ENCOUNTER — Encounter: Payer: Self-pay | Admitting: Cardiovascular Disease

## 2021-09-15 ENCOUNTER — Ambulatory Visit (HOSPITAL_COMMUNITY)
Admission: RE | Admit: 2021-09-15 | Discharge: 2021-09-15 | Disposition: A | Discharge status: Home / Self Care | Payer: Medicare HMO | Source: Ambulatory Visit | Attending: Cardiology | Admitting: Cardiology

## 2021-09-15 DIAGNOSIS — R55 Syncope and collapse: Secondary | ICD-10-CM | POA: Insufficient documentation

## 2021-09-29 DIAGNOSIS — H6690 Otitis media, unspecified, unspecified ear: Secondary | ICD-10-CM | POA: Diagnosis not present

## 2021-10-03 DIAGNOSIS — D164 Benign neoplasm of bones of skull and face: Secondary | ICD-10-CM | POA: Diagnosis not present

## 2021-10-03 DIAGNOSIS — H6121 Impacted cerumen, right ear: Secondary | ICD-10-CM | POA: Diagnosis not present

## 2021-11-07 DIAGNOSIS — H524 Presbyopia: Secondary | ICD-10-CM | POA: Diagnosis not present

## 2021-11-07 DIAGNOSIS — H5213 Myopia, bilateral: Secondary | ICD-10-CM | POA: Diagnosis not present

## 2021-11-07 DIAGNOSIS — Z961 Presence of intraocular lens: Secondary | ICD-10-CM | POA: Diagnosis not present

## 2021-11-07 DIAGNOSIS — H43392 Other vitreous opacities, left eye: Secondary | ICD-10-CM | POA: Diagnosis not present

## 2021-11-07 DIAGNOSIS — H52203 Unspecified astigmatism, bilateral: Secondary | ICD-10-CM | POA: Diagnosis not present

## 2021-12-22 DIAGNOSIS — M542 Cervicalgia: Secondary | ICD-10-CM | POA: Diagnosis not present

## 2022-01-24 DIAGNOSIS — Z23 Encounter for immunization: Secondary | ICD-10-CM | POA: Diagnosis not present

## 2022-01-24 DIAGNOSIS — K219 Gastro-esophageal reflux disease without esophagitis: Secondary | ICD-10-CM | POA: Diagnosis not present

## 2022-01-24 DIAGNOSIS — R5383 Other fatigue: Secondary | ICD-10-CM | POA: Diagnosis not present

## 2022-01-24 DIAGNOSIS — G4733 Obstructive sleep apnea (adult) (pediatric): Secondary | ICD-10-CM | POA: Diagnosis not present

## 2022-01-24 DIAGNOSIS — Z125 Encounter for screening for malignant neoplasm of prostate: Secondary | ICD-10-CM | POA: Diagnosis not present

## 2022-01-24 DIAGNOSIS — E7800 Pure hypercholesterolemia, unspecified: Secondary | ICD-10-CM | POA: Diagnosis not present

## 2022-01-24 DIAGNOSIS — I471 Supraventricular tachycardia, unspecified: Secondary | ICD-10-CM | POA: Diagnosis not present

## 2022-01-24 DIAGNOSIS — Z1331 Encounter for screening for depression: Secondary | ICD-10-CM | POA: Diagnosis not present

## 2022-01-24 DIAGNOSIS — Z0000 Encounter for general adult medical examination without abnormal findings: Secondary | ICD-10-CM | POA: Diagnosis not present

## 2022-03-14 ENCOUNTER — Ambulatory Visit: Payer: Medicare HMO | Admitting: Diagnostic Neuroimaging

## 2022-03-14 ENCOUNTER — Encounter: Payer: Self-pay | Admitting: Diagnostic Neuroimaging

## 2022-03-14 VITALS — BP 145/67 | HR 53 | Ht 71.0 in | Wt 172.0 lb

## 2022-03-14 DIAGNOSIS — R519 Headache, unspecified: Secondary | ICD-10-CM | POA: Diagnosis not present

## 2022-03-14 DIAGNOSIS — R200 Anesthesia of skin: Secondary | ICD-10-CM

## 2022-03-14 DIAGNOSIS — R42 Dizziness and giddiness: Secondary | ICD-10-CM | POA: Diagnosis not present

## 2022-03-14 NOTE — Patient Instructions (Signed)
  LEFT ARM NUMBNESS (transient; with chest pressure x 1; other events mainly at night time and wake up from sleep) - suspect peripheral process; cervical radiculopathy vs ulnar neuropathy  LEFT EYE VISION CHANGES (transient) - possible migraine phenomenon; some left sided headaches, burning pressure sensations

## 2022-03-14 NOTE — Progress Notes (Unsigned)
[facility]  PATIENT: David [name]. DOB: [birth year]  REFERRING CLINICIAN: Maury Dus, MD HISTORY FROM: patient  REASON FOR VISIT: new consult    HISTORICAL  CHIEF COMPLAINT:  Chief Complaint  Patient presents with   New Patient (Initial Visit)    Pt with wife, rm 7. He has been having complaints of dizziness/light headed. He put his arm around wife and left arm went numb. Went to ER in May 13,2023 [facility]. 5/19 went to cardiologist completed echo and CTA. Increased cholesterol med. He has had moments, last time was last week. He was reading with glasses on in the bottom part of vision there was ripple look to vision. Mostly with left eye lasted 10 min and went away. Numbness in the left arm has been mostly when sleeping.    Other    Has also followed up with orthro from previous neck surgery    HISTORY OF PRESENT ILLNESS:   72 year old male here for evaluation of left arm numbness.  History of carpal tunnel syndrome on the left side status post surgery in 2018.  Also had cervical spine surgery in 2016.   Since then has had multiple episodes of left arm numbness which mainly wake him up from sleep.  He wakes up shakes his arm and symptoms go away.  Symptoms affect his arm and digits 3-5.  1 time had an episode of wavy lines in his left eye lasting 5 to 10 minutes.  Also had some left-sided headaches with burning pressure sensation for past 6 months.  Also had episode of numbness in the left arm, left chest, rapid heart rate, went to ER for evaluation and cardiac workup which was negative.    REVIEW OF SYSTEMS: Full 14 system review of systems performed and negative with exception of: as per HPI.  ALLERGIES: No Known Allergies  HOME MEDICATIONS: Outpatient Medications Prior to Visit  Medication Sig Dispense Refill   aspirin EC 81 MG tablet Take 81 mg by mouth daily.     diltiazem (CARDIZEM CD) 180 MG 24 hr capsule Take 180 mg by mouth daily.      diltiazem (DILACOR XR) 180 MG 24 hr capsule Take 180 mg by mouth 2 (two) times daily.      esomeprazole (NEXIUM) 40 MG capsule Take 40 mg by mouth daily.      Multiple Vitamins-Minerals (MULTIVITAMIN WITH MINERALS) tablet Take 1 tablet by mouth daily.     rosuvastatin (CRESTOR) 20 MG tablet Take 1 tablet (20 mg total) by mouth at bedtime. 90 tablet 3   SHINGRIX injection      No facility-administered medications prior to visit.    PAST MEDICAL HISTORY: Past Medical History:  Diagnosis Date   Back pain, chronic    CAD (coronary artery disease) 2009   nonobstructive by cath   Chronic knee pain    Chronic low back pain    Dysrhythmia    GERD (gastroesophageal reflux disease)    History of PSVT (paroxysmal supraventricular tachycardia) 2009   nonsustained atach per Dr Marlou Porch on monitor in 2009. treated with diltiazem   Hyperlipidemia    INTOLERANT OF STATINS   Hypertension    Knee pain, right    PARTICULARY AT NIGHT   Near syncope 07/23/2014   OSA (obstructive sleep apnea)    BIPAP   Palpitations     PAST SURGICAL HISTORY: Past Surgical History:  Procedure Laterality Date   ANTERIOR CERVICAL DECOMPRESSION/DISCECTOMY FUSION 4 LEVELS N/A 12/22/2014  Procedure: ANTERIOR CERVICAL DECOMPRESSION/DISCECTOMY FUSION 4 LEVELS;  Surgeon: Phylliss Bob, MD;  Location: [facility];  Service: Orthopedics;  Laterality: N/A;  Anteriro cervical decompression fusion, cervical 3-4, cervical 4-5, cervical 5-6, cervical 6-7 with instrumentation and allograft   CHOLECYSTECTOMY     COLONOSCOPY     2003, 2008, 2013   CORONARY ANGIOPLASTY  07/2007   NONOBSTRUCTIVE CORONARY PLAQUING- MINOR 20 % OSTIAL DIAGONAL STENOSIS, NORMAL LEFT VENTRICULAR EF 55%   ERCP  2006   KNEE ARTHROSCOPY Right 1982   LAPAROSCOPIC CHOLECYSTECTOMY  2006   VASECTOMY  1980    FAMILY HISTORY: Family History  Problem Relation Age of Onset   Heart attack Mother 54   CVA Mother 55       AND AGAIN AT 4   Colon cancer Father     Pneumonia Father        WITH SEPSIS   Heart attack Father    Hyperlipidemia Sister    Hypertension Sister    GER disease Sister    Stroke Brother 68   Hypertension Brother    Dysrhythmia Brother        and DIZZINESS   Diabetes Maternal Grandmother     SOCIAL HISTORY: Social History   Socioeconomic History   Marital status: Married    Spouse name: Not on file   Number of children: 2   Years of education: Not on file   Highest education level: Not on file  Occupational History   Occupation: ACCOUNTANT  Tobacco Use   Smoking status: Former    Packs/day: 1.00    Types: Cigarettes    Quit date: 02/20/2000    Years since quitting: 22.0   Smokeless tobacco: Never  Substance and Sexual Activity   Alcohol use: Yes    Comment: 3 beers a day   Drug use: No   Sexual activity: Not on file  Other Topics Concern   Not on file  Social History Narrative   Lives in [place] with spouse.  2 daughters.   Social Determinants of Health   Financial Resource Strain: Not on file  Food Insecurity: Not on file  Transportation Needs: Not on file  Physical Activity: Not on file  Stress: Not on file  Social Connections: Not on file  Intimate Partner Violence: Not on file     PHYSICAL EXAM  GENERAL EXAM/CONSTITUTIONAL: Vitals:  Vitals:   03/14/22 1121  BP: (!) 145/67  Pulse: (!) 53  Weight: 172 lb (78 kg)  Height: '5\' 11"'$  (1.803 m)   Body mass index is 23.99 kg/m. Wt Readings from Last 3 Encounters:  03/14/22 172 lb (78 kg)  09/06/21 174 lb (78.9 kg)  07/07/21 168 lb (76.2 kg)   Patient is in no distress; well developed, nourished and groomed; neck is supple  CARDIOVASCULAR: Examination of carotid arteries is normal; no carotid bruits Regular rate and rhythm, no murmurs Examination of peripheral vascular system by observation and palpation is normal  EYES: Ophthalmoscopic exam of optic discs and posterior segments is normal; no papilledema or hemorrhages No results  found.  MUSCULOSKELETAL: Gait, strength, tone, movements noted in Neurologic exam below  NEUROLOGIC: MENTAL STATUS:      No data to display         awake, alert, oriented to person, place and time recent and remote memory intact normal attention and concentration language fluent, comprehension intact, naming intact fund of knowledge appropriate  CRANIAL NERVE:  2nd - no papilledema on fundoscopic exam 2nd, 3rd, 4th, 6th -  pupils equal and reactive to light, visual fields full to confrontation, extraocular muscles intact, no nystagmus 5th - facial sensation symmetric 7th - facial strength symmetric 8th - hearing intact 9th - palate elevates symmetrically, uvula midline 11th - shoulder shrug symmetric 12th - tongue protrusion midline  MOTOR:  normal bulk and tone, full strength in the BUE, BLE  SENSORY:  normal and symmetric to light touch, pinprick, temperature, vibration  COORDINATION:  finger-nose-finger, fine finger movements normal  REFLEXES:  deep tendon reflexes TRACE and symmetric  GAIT/STATION:  narrow based gait     DIAGNOSTIC DATA (LABS, IMAGING, TESTING) - I reviewed patient records, labs, notes, testing and imaging myself where available.  Lab Results  Component Value Date   WBC 5.3 07/01/2021   HGB 13.3 07/01/2021   HCT 40.8 07/01/2021   MCV 91.7 07/01/2021   PLT 249 07/01/2021      Component Value Date/Time   NA 136 07/01/2021 0957   K 4.3 07/01/2021 0957   CL 106 07/01/2021 0957   CO2 26 07/01/2021 0957   GLUCOSE 109 (H) 07/01/2021 0957   BUN 19 07/01/2021 0957   CREATININE 0.93 07/01/2021 0957   CALCIUM 9.1 07/01/2021 0957   PROT 7.7 09/15/2017 1628   ALBUMIN 4.1 09/15/2017 1628   AST 27 09/15/2017 1628   ALT 31 09/15/2017 1628   ALKPHOS 40 09/15/2017 1628   BILITOT 1.6 (H) 09/15/2017 1628   GFRNONAA >60 07/01/2021 0957   GFRAA >60 09/18/2017 0412   Lab Results  Component Value Date   CHOL 124 07/10/2021   HDL 55  07/10/2021   LDLCALC 60 07/10/2021   TRIG 33 07/10/2021   CHOLHDL 2.3 07/10/2021   No results found for: "HGBA1C" No results found for: "VITAMINB12" Lab Results  Component Value Date   TSH 2.460 07/10/2021    09/15/21  Right Carotid: Velocities in the right ICA are consistent with a 1-39%  stenosis.   Left Carotid: The extracranial vessels were near-normal with only minimal  wall thickening or plaque.   Vertebrals:  Bilateral vertebral arteries demonstrate antegrade flow.  Subclavians: Normal flow hemodynamics were seen in bilateral subclavian arteries.     ASSESSMENT AND PLAN  72 y.o. year old male here with:   Dx:  1. Dizziness   2. Left arm numbness   3. New onset of headaches after age 69     PLAN:  LEFT ARM NUMBNESS (transient; with chest pressure x 1; other events mainly at night time and wake up from sleep) - suspect peripheral process; cervical radiculopathy vs ulnar neuropathy  LEFT EYE VISION CHANGES (transient) - possible migraine phenomenon; some left sided headaches, burning pressure sensations  Orders Placed This Encounter  Procedures   MR [place]   Return for pending if symptoms worsen or fail to improve, return to PCP.    Penni Bombard, MD 07/24/5407, 81:19 AM Certified in Neurology, Neurophysiology and Neuroimaging  [facility] [address], [place], [place] [address] [phone]

## 2022-03-15 ENCOUNTER — Encounter: Payer: Self-pay | Admitting: Diagnostic Neuroimaging

## 2022-03-20 ENCOUNTER — Telehealth: Payer: Self-pay | Admitting: Diagnostic Neuroimaging

## 2022-03-20 NOTE — Telephone Encounter (Signed)
Mcarthur Rossetti Josem Kaufmann: [phone] exp. 03/20/22-04/19/22 sent to GI [phone]

## 2022-03-24 ENCOUNTER — Ambulatory Visit
Admission: RE | Admit: 2022-03-24 | Discharge: 2022-03-24 | Disposition: A | Discharge status: Home / Self Care | Payer: Medicare HMO | Source: Ambulatory Visit | Attending: Diagnostic Neuroimaging | Admitting: Diagnostic Neuroimaging

## 2022-03-24 DIAGNOSIS — R519 Headache, unspecified: Secondary | ICD-10-CM

## 2022-03-24 DIAGNOSIS — R200 Anesthesia of skin: Secondary | ICD-10-CM | POA: Diagnosis not present

## 2022-03-24 DIAGNOSIS — R42 Dizziness and giddiness: Secondary | ICD-10-CM

## 2022-03-24 MED ORDER — GADOPICLENOL 0.5 MMOL/ML IV SOLN
8.0000 mL | Freq: Once | INTRAVENOUS | Status: AC | PRN
  Administered 2022-03-24: 8 mL via INTRAVENOUS

## 2022-03-28 DIAGNOSIS — J0140 Acute pansinusitis, unspecified: Secondary | ICD-10-CM | POA: Diagnosis not present

## 2022-04-12 DIAGNOSIS — G4733 Obstructive sleep apnea (adult) (pediatric): Secondary | ICD-10-CM | POA: Diagnosis not present

## 2022-04-20 DIAGNOSIS — I6789 Other cerebrovascular disease: Secondary | ICD-10-CM | POA: Diagnosis not present

## 2022-04-20 DIAGNOSIS — J323 Chronic sphenoidal sinusitis: Secondary | ICD-10-CM | POA: Diagnosis not present

## 2022-04-20 DIAGNOSIS — I10 Essential (primary) hypertension: Secondary | ICD-10-CM | POA: Diagnosis not present

## 2022-04-20 DIAGNOSIS — E7800 Pure hypercholesterolemia, unspecified: Secondary | ICD-10-CM | POA: Diagnosis not present

## 2022-04-20 DIAGNOSIS — I2510 Atherosclerotic heart disease of native coronary artery without angina pectoris: Secondary | ICD-10-CM | POA: Diagnosis not present

## 2022-04-20 DIAGNOSIS — I471 Supraventricular tachycardia, unspecified: Secondary | ICD-10-CM | POA: Diagnosis not present

## 2022-04-24 ENCOUNTER — Telehealth: Payer: Self-pay | Admitting: Diagnostic Neuroimaging

## 2022-04-24 NOTE — Telephone Encounter (Signed)
[facility] Dr. Roma Kayser office Danae Chen) calling regards to results of MRI results and any recommendations. Would like a call back.

## 2022-04-25 ENCOUNTER — Encounter: Payer: Self-pay | Admitting: Neurology

## 2022-04-25 NOTE — Telephone Encounter (Signed)
Attempted to call [facility] back. There was no answer and a VM but the VM wasn't secure so I did not leave a message

## 2022-04-26 NOTE — Telephone Encounter (Signed)
Danae Chen, RN from [facility] [phone]) has called back asking this question be forwarded to [place], Therapist, sports.  When calling back she has asked that a vm be left with response because she is leaving for the day soon : Dr Mannie Stabile has pt on baby asprin for long term use, there is evidence of a stroke from MRI. Dr Mannie Stabile wants to know if Dr Leta Baptist wants pt to be increased to '325mg'$  of asprin or does he want want to change agent?

## 2022-04-26 NOTE — Telephone Encounter (Signed)
Called the nurse back. 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 Advised Dr Leta Baptist states to continue current treatment plan and did not feel like asa should be increased. I have already spoke with the patient and provided the results and made aware as well.

## 2022-05-01 ENCOUNTER — Telehealth: Payer: Self-pay | Admitting: Cardiovascular Disease

## 2022-05-01 NOTE — Telephone Encounter (Signed)
Pt would like to switch providers from Dr. Claiborne Billings to Dr. Ellyn Hack. Please advise.

## 2022-05-02 NOTE — Telephone Encounter (Signed)
Ok by me

## 2022-05-03 NOTE — Telephone Encounter (Signed)
I guess OK  [facility]

## 2022-05-15 DIAGNOSIS — M25561 Pain in right knee: Secondary | ICD-10-CM | POA: Diagnosis not present

## 2022-05-15 DIAGNOSIS — M25551 Pain in right hip: Secondary | ICD-10-CM | POA: Diagnosis not present

## 2022-05-22 DIAGNOSIS — Z800 Family history of malignant neoplasm of digestive organs: Secondary | ICD-10-CM | POA: Diagnosis not present

## 2022-05-22 DIAGNOSIS — Z09 Encounter for follow-up examination after completed treatment for conditions other than malignant neoplasm: Secondary | ICD-10-CM | POA: Diagnosis not present

## 2022-05-22 DIAGNOSIS — D122 Benign neoplasm of ascending colon: Secondary | ICD-10-CM | POA: Diagnosis not present

## 2022-05-22 DIAGNOSIS — K5730 Diverticulosis of large intestine without perforation or abscess without bleeding: Secondary | ICD-10-CM | POA: Diagnosis not present

## 2022-05-22 DIAGNOSIS — D123 Benign neoplasm of transverse colon: Secondary | ICD-10-CM | POA: Diagnosis not present

## 2022-05-22 DIAGNOSIS — Z86010 Personal history of colonic polyps: Secondary | ICD-10-CM | POA: Diagnosis not present

## 2022-05-24 DIAGNOSIS — D123 Benign neoplasm of transverse colon: Secondary | ICD-10-CM | POA: Diagnosis not present

## 2022-05-24 DIAGNOSIS — D122 Benign neoplasm of ascending colon: Secondary | ICD-10-CM | POA: Diagnosis not present

## 2022-06-01 DIAGNOSIS — I10 Essential (primary) hypertension: Secondary | ICD-10-CM | POA: Diagnosis not present

## 2022-06-01 DIAGNOSIS — J323 Chronic sphenoidal sinusitis: Secondary | ICD-10-CM | POA: Diagnosis not present

## 2022-06-01 DIAGNOSIS — Z9989 Dependence on other enabling machines and devices: Secondary | ICD-10-CM | POA: Diagnosis not present

## 2022-06-01 DIAGNOSIS — I6789 Other cerebrovascular disease: Secondary | ICD-10-CM | POA: Diagnosis not present

## 2022-06-29 NOTE — Progress Notes (Unsigned)
Cardiology Clinic Note   Patient Name: David [name]. Date of Encounter: 07/02/2022  Primary Care Provider:  Elias Else, MD (Inactive) Primary Cardiologist:  Bryan Lemma, MD  Patient Profile    David [name]. 72 year old male presents the clinic today for follow-up evaluation of his SVT and near syncope.  Past Medical History    Past Medical History:  Diagnosis Date   Back pain, chronic    CAD (coronary artery disease) 2009   nonobstructive by cath   Chronic knee pain    Chronic low back pain    Dysrhythmia    GERD (gastroesophageal reflux disease)    History of PSVT (paroxysmal supraventricular tachycardia) 2009   nonsustained atach per Dr Anne Fu on monitor in 2009. treated with diltiazem   Hyperlipidemia    INTOLERANT OF STATINS   Hypertension    Knee pain, right    PARTICULARY AT NIGHT   Near syncope 07/23/2014   OSA (obstructive sleep apnea)    BIPAP   Palpitations    Past Surgical History:  Procedure Laterality Date   ANTERIOR CERVICAL DECOMPRESSION/DISCECTOMY FUSION 4 LEVELS N/A 12/22/2014   Procedure: ANTERIOR CERVICAL DECOMPRESSION/DISCECTOMY FUSION 4 LEVELS;  Surgeon: Estill Bamberg, MD;  Location: [facility] OR;  Service: Orthopedics;  Laterality: N/A;  Anteriro cervical decompression fusion, cervical 3-4, cervical 4-5, cervical 5-6, cervical 6-7 with instrumentation and allograft   CHOLECYSTECTOMY     COLONOSCOPY     2003, 2008, 2013   CORONARY ANGIOPLASTY  07/2007   NONOBSTRUCTIVE CORONARY PLAQUING- MINOR 20 % OSTIAL DIAGONAL STENOSIS, NORMAL LEFT VENTRICULAR EF 55%   ERCP  2006   KNEE ARTHROSCOPY Right 1982   LAPAROSCOPIC CHOLECYSTECTOMY  2006   VASECTOMY  1980    Allergies  No Known Allergies  History of Present Illness    David [name]. has a PMH of SVT, near syncope, small bowel obstruction, radiculopathy, AKI, dizziness, carotid bruit, and chest pain.  Carotid Dopplers 09/15/2021 showed 1-39% right ICA stenosis and minimal wall  thickening or plaque left carotid.  He was initially referred to cardiology by his PCP for evaluation of his chest discomfort and near syncope.  He reported a history of PSVT that had been present for 15-20 years.  He had been prescribed diltiazem 180 mg twice daily.  He was diagnosed with OSA in 2009 and is on CPAP therapy.  During his initial evaluation with cardiology he reported that over the previous 4 to 6 weeks he had very mild lightheadedness.  He was exercising using his treadmill, doing crosstraining, and elliptical type exercises.  On May 13 he developed left arm numbness and slight chest pressure.  He reported to the emergency room at [facility].  He experienced a long wait time and was eventually put back in the waiting room.  His name fell off the wait list and he left without being seen.  He followed up with his PCP.  He was noted to have a new right bundle branch block.  He was seen for evaluation by Dr. Tresa Endo.  He denied recurrent chest discomfort.  He denied syncope.  His SVT was controlled with diltiazem.  He reported CPAP compliance.  He was noted to have a systolic cardiac murmur.  A echocardiogram was ordered and showed an EF of 60-65%, no wall motion abnormalities.  His coronary CTA 6/23 showed a coronary calcium score of 407.  He was noted to have moderate 60-69% ostial stenosis of his diagonal branch of the LAD.  He was noted to have mild plaque 25-49% of his proximal LAD, ostial diagonal 2, mid circumflex, and proximal OM 2.  FFR could not be performed.  He was seen in follow-up by Dr. Tresa Endo on 09/06/2021.  During that time he remained asymptomatic.  He did note some vague right visual symptoms.  He reported an episode of increasing blood pressure up to 169/88.  He was continued on rosuvastatin 20 mg.  His LP(a) was normal at 39.7.  His LDL cholesterol was noted to be 60.  He presents to the clinic today for follow-up evaluation and states he returned from a trip to [place].  He  reports that he did have some chest discomfort while he was there.  He noticed the chest discomfort after driving.  He continues to be very physically active walking 4 miles several days per week.  He did have a brain MRI which showed microvascular changes.  His PCP started him on olmesartan.  His blood pressure is well-controlled at 116/62.  We reviewed coronary artery disease and his coronary CTA.  He expressed understanding.  His biggest concern today is recent headaches.  I will have him maintain his physical activity, continue current medication regimen and plan follow-up in 9 to 12 months.  Today he denies shortness of breath, lower extremity edema, fatigue, palpitations, melena, hematuria, hemoptysis, diaphoresis, weakness, presyncope, syncope, orthopnea, and PND.   Home Medications    Prior to Admission medications   Medication Sig Start Date End Date Taking? Authorizing Provider  aspirin EC 81 MG tablet Take 81 mg by mouth daily.    [provider]  diltiazem (CARDIZEM CD) 180 MG 24 hr capsule Take 180 mg by mouth daily. 11/25/21   [provider]  diltiazem (DILACOR XR) 180 MG 24 hr capsule Take 180 mg by mouth 2 (two) times daily.     [provider]  esomeprazole (NEXIUM) 40 MG capsule Take 40 mg by mouth daily.     [provider]  Multiple Vitamins-Minerals (MULTIVITAMIN WITH MINERALS) tablet Take 1 tablet by mouth daily.    [provider]  rosuvastatin (CRESTOR) 20 MG tablet Take 1 tablet (20 mg total) by mouth at bedtime. 09/06/21   Lennette Bihari, MD  [facility] injection  05/16/21   [provider]    Family History    Family History  Problem Relation Age of Onset   Heart attack Mother 79   CVA Mother 87       AND AGAIN AT 33   Colon cancer Father    Pneumonia Father        WITH SEPSIS   Heart attack Father    Hyperlipidemia Sister    Hypertension Sister    GER disease Sister    Stroke Brother 47   Hypertension  Brother    Dysrhythmia Brother        and DIZZINESS   Diabetes Maternal Grandmother    He indicated that his mother is deceased. He indicated that his father is deceased. He indicated that all of his three sisters are alive. He indicated that three of his four brothers are alive. He indicated that his maternal grandmother is deceased. He indicated that his maternal grandfather is deceased. He indicated that his paternal grandmother is deceased. He indicated that his paternal grandfather is deceased.  Social History    Social History   Socioeconomic History   Marital status: Married    Spouse name: Not on file   Number of  children: 2   Years of education: Not on file   Highest education level: Not on file  Occupational History   Occupation: ACCOUNTANT  Tobacco Use   Smoking status: Former    Packs/day: 1    Types: Cigarettes    Quit date: 02/20/2000    Years since quitting: 22.3   Smokeless tobacco: Never  Substance and Sexual Activity   Alcohol use: Yes    Comment: 3 beers a day   Drug use: No   Sexual activity: Not on file  Other Topics Concern   Not on file  Social History Narrative   Lives in [place] with spouse.  2 daughters.   Social Determinants of Health   Financial Resource Strain: Not on file  Food Insecurity: Not on file  Transportation Needs: Not on file  Physical Activity: Not on file  Stress: Not on file  Social Connections: Not on file  Intimate Partner Violence: Not on file     Review of Systems    General:  No chills, fever, night sweats or weight changes.  Cardiovascular:  No chest pain, dyspnea on exertion, edema, orthopnea, palpitations, paroxysmal nocturnal dyspnea. Dermatological: No rash, lesions/masses Respiratory: No cough, dyspnea Urologic: No hematuria, dysuria Abdominal:   No nausea, vomiting, diarrhea, bright red blood per rectum, melena, or hematemesis Neurologic:  No visual changes, wkns, changes in mental status. All other  systems reviewed and are otherwise negative except as noted above.  Physical Exam    VS:  BP 116/62   Pulse (!) 55   Ht 5\' 11"  (1.803 m)   Wt 176 lb 12.8 oz (80.2 kg)   SpO2 98%   BMI 24.66 kg/m  , BMI Body mass index is 24.66 kg/m. GEN: Well nourished, well developed, in no acute distress. HEENT: normal. Neck: Supple, no JVD, carotid bruits, or masses. Cardiac: RRR, no murmurs, rubs, or gallops. No clubbing, cyanosis, edema.  Radials/DP/PT 2+ and equal bilaterally.  Respiratory:  Respirations regular and unlabored, clear to auscultation bilaterally. GI: Soft, nontender, nondistended, BS + x 4. MS: no deformity or atrophy. Skin: warm and dry, no rash. Neuro:  Strength and sensation are intact. Psych: Normal affect.  Accessory Clinical Findings    Recent Labs: 07/10/2021: TSH 2.460   Recent Lipid Panel    Component Value Date/Time   CHOL 124 07/10/2021 1031   TRIG 33 07/10/2021 1031   HDL 55 07/10/2021 1031   CHOLHDL 2.3 07/10/2021 1031   LDLCALC 60 07/10/2021 1031         ECG personally reviewed by me today-sinus bradycardia right bundle branch block 55 bpm- No acute changes  Echocardiogram: 07/14/2021  1. Left ventricular ejection fraction, by estimation, is 60 to 65%. The  left ventricle has normal function. The left ventricle has no regional  wall motion abnormalities. Left ventricular diastolic parameters were  normal. The average left ventricular  global longitudinal strain is -23.8 %. The global longitudinal strain is  normal.   2. Right ventricular systolic function is normal. The right ventricular  size is normal. There is normal pulmonary artery systolic pressure. The  estimated right ventricular systolic pressure is 27.4 mmHg.   3. The mitral valve is grossly normal. Trivial mitral valve  regurgitation. No evidence of mitral stenosis.   4. The aortic valve is tricuspid. Aortic valve regurgitation is not  visualized. No aortic stenosis is present.   5.  The inferior vena cava is normal in size with greater than 50%  respiratory variability, suggesting  right atrial pressure of 3 mmHg.   Comparison(s): Report only from [facility] 07/10/14 EF 55-60%.     Assessment & Plan   1.  Coronary artery disease-no chest pain today.  Did note some chest discomfort while in [place] after driving.  Appears to be MSK in nature.  Coronary CTA showed a coronary calcium score of 407 placing him in the 69th percentile for age sex and race matched control.  Details above.  He was subsequently started on rosuvastatin.  His cholesterol improved to an LDL of 60.  Continues to be very physically active. Continue rosuvastatin, aspirin Heart healthy low-sodium high-fiber diet  PSVT-denies recent episodes.  Present for 15-20 years. Continue current medical therapy Avoid triggers caffeine, chocolate, EtOH, dehydration etc.  Cardiac murmur-systolic 2-6 murmur heard along left sternal border.  Echocardiogram 5/23 showed trivial mitral valve regurgitation.  Denies lightheadedness, presyncope or syncope. Repeat echocardiogram when clinically indicated  Carotid stenosis-carotid duplex 09/15/2021 showed 1-39% right, and left carotid with minimal wall thickening. Continue rosuvastatin, aspirin  OSA-compliant with CPAP.  Waking up well rested. Continue CPAP use Avoid supine sleeping, sedatives   Disposition: Follow-up with Dr. Herbie Baltimore or me in 9-12 months.  Thomasene Ripple. Darryle Dennie NP-C     07/02/2022, 11:35 AM [facility] [address] Office [phone] Fax [phone]    I spent 14 minutes examining this patient, reviewing medications, and using patient centered shared decision making involving her cardiac care.  Prior to her visit I spent greater than 20 minutes reviewing her past medical history,  medications, and prior cardiac tests.

## 2022-07-02 ENCOUNTER — Encounter: Payer: Self-pay | Admitting: General Practice

## 2022-07-02 ENCOUNTER — Ambulatory Visit: Payer: Medicare HMO | Attending: General Practice | Admitting: General Practice

## 2022-07-02 VITALS — BP 116/62 | HR 55 | Ht 71.0 in | Wt 176.8 lb

## 2022-07-02 DIAGNOSIS — I471 Supraventricular tachycardia, unspecified: Secondary | ICD-10-CM | POA: Diagnosis not present

## 2022-07-02 DIAGNOSIS — R011 Cardiac murmur, unspecified: Secondary | ICD-10-CM | POA: Diagnosis not present

## 2022-07-02 DIAGNOSIS — R931 Abnormal findings on diagnostic imaging of heart and coronary circulation: Secondary | ICD-10-CM

## 2022-07-02 DIAGNOSIS — G4733 Obstructive sleep apnea (adult) (pediatric): Secondary | ICD-10-CM | POA: Diagnosis not present

## 2022-07-02 NOTE — Patient Instructions (Addendum)
Medication Instructions:  The current medical regimen is effective;  continue present plan and medications as directed. Please refer to the Current Medication list given to you today.  *If you need a refill on your cardiac medications before your next appointment, please call your pharmacy*  Lab Work: NONE If you have labs (blood work) drawn today and your tests are completely normal, you will receive your results only by:  MyChart Message (if you have MyChart) OR  A paper copy in the mail If you have any lab test that is abnormal or we need to change your treatment, we will call you to review the results.     Follow-Up: At [facility], you and your health needs are our priority.  As part of our continuing mission to provide you with exceptional heart care, we have created designated Provider Care Teams.  These Care Teams include your primary Cardiologist (physician) and Advanced Practice Providers (APPs -  Physician Assistants and Nurse Practitioners) who all work together to provide you with the care you need, when you need it.  Your next appointment:   9-12  month(s)  Provider:   Bryan Lemma, MD       High-Fiber Eating Plan Fiber, also called dietary fiber, is a type of carbohydrate. It is found foods such as fruits, vegetables, whole grains, and beans. A high-fiber diet can have many health benefits. Your health care provider may recommend a high-fiber diet to help: Prevent constipation. Fiber can make your bowel movements more regular. Lower your cholesterol. Relieve the following conditions: Inflammation of veins in the anus (hemorrhoids). Inflammation of specific areas of the digestive tract (uncomplicated diverticulosis). A problem of the large intestine, also called the colon, that sometimes causes pain and diarrhea (irritable bowel syndrome, or IBS). Prevent overeating as part of a weight-loss plan. Prevent heart disease, type 2 diabetes, and certain cancers. What  are tips for following this plan? Reading food labels  Check the nutrition facts label on food products for the amount of dietary fiber. Choose foods that have 5 grams of fiber or more per serving. The goals for recommended daily fiber intake include: Men (age 92 or younger): 34-38 g. Men (over age 24): 28-34 g. Women (age 70 or younger): 25-28 g. Women (over age 64): 22-25 g. Your daily fiber goal is _____________ g. Shopping Choose whole fruits and vegetables instead of processed forms, such as apple juice or applesauce. Choose a wide variety of high-fiber foods such as avocados, lentils, oats, and kidney beans. Read the nutrition facts label of the foods you choose. Be aware of foods with added fiber. These foods often have high sugar and sodium amounts per serving. Cooking Use whole-grain flour for baking and cooking. Cook with brown rice instead of white rice. Meal planning Start the day with a breakfast that is high in fiber, such as a cereal that contains 5 g of fiber or more per serving. Eat breads and cereals that are made with whole-grain flour instead of refined flour or white flour. Eat brown rice, bulgur wheat, or millet instead of white rice. Use beans in place of meat in soups, salads, and pasta dishes. Be sure that half of the grains you eat each day are whole grains. General information You can get the recommended daily intake of dietary fiber by: Eating a variety of fruits, vegetables, grains, nuts, and beans. Taking a fiber supplement if you are not able to take in enough fiber in your diet. It is  better to get fiber through food than from a supplement. Gradually increase how much fiber you consume. If you increase your intake of dietary fiber too quickly, you may have bloating, cramping, or gas. Drink plenty of water to help you digest fiber. Choose high-fiber snacks, such as berries, raw vegetables, nuts, and popcorn. What foods should I eat? Fruits Berries.  Pears. Apples. Oranges. Avocado. Prunes and raisins. Dried figs. Vegetables Sweet potatoes. Spinach. Kale. Artichokes. Cabbage. Broccoli. Cauliflower. Green peas. Carrots. Squash. Grains Whole-grain breads. Multigrain cereal. Oats and oatmeal. Brown rice. Barley. Bulgur wheat. Millet. Quinoa. Bran muffins. Popcorn. Rye wafer crackers. Meats and other proteins Navy beans, kidney beans, and pinto beans. Soybeans. Split peas. Lentils. Nuts and seeds. Dairy Fiber-fortified yogurt. Beverages Fiber-fortified soy milk. Fiber-fortified orange juice. Other foods Fiber bars. The items listed above may not be a complete list of recommended foods and beverages. Contact a dietitian for more information. What foods should I avoid? Fruits Fruit juice. Cooked, strained fruit. Vegetables Fried potatoes. Canned vegetables. Well-cooked vegetables. Grains White bread. Pasta made with refined flour. White rice. Meats and other proteins Fatty cuts of meat. Fried chicken or fried fish. Dairy Milk. Yogurt. Cream cheese. Sour cream. Fats and oils Butters. Beverages Soft drinks. Other foods Cakes and pastries. The items listed above may not be a complete list of foods and beverages to avoid. Talk with your dietitian about what choices are best for you. Summary Fiber is a type of carbohydrate. It is found in foods such as fruits, vegetables, whole grains, and beans. A high-fiber diet has many benefits. It can help to prevent constipation, lower blood cholesterol, aid weight loss, and reduce your risk of heart disease, diabetes, and certain cancers. Increase your intake of fiber gradually. Increasing fiber too quickly may cause cramping, bloating, and gas. Drink plenty of water while you increase the amount of fiber you consume. The best sources of fiber include whole fruits and vegetables, whole grains, nuts, seeds, and beans. This information is not intended to replace advice given to you by your health  care provider. Make sure you discuss any questions you have with your health care provider. Document Revised: 06/11/2019 Document Reviewed: 06/11/2019 Elsevier Patient Education  2023 [employer].

## 2022-07-03 DIAGNOSIS — G5602 Carpal tunnel syndrome, left upper limb: Secondary | ICD-10-CM | POA: Diagnosis not present

## 2022-08-13 DIAGNOSIS — D122 Benign neoplasm of ascending colon: Secondary | ICD-10-CM | POA: Diagnosis not present

## 2022-08-15 DIAGNOSIS — I10 Essential (primary) hypertension: Secondary | ICD-10-CM | POA: Diagnosis not present

## 2022-08-21 DIAGNOSIS — D122 Benign neoplasm of ascending colon: Secondary | ICD-10-CM | POA: Diagnosis not present

## 2022-08-21 DIAGNOSIS — K219 Gastro-esophageal reflux disease without esophagitis: Secondary | ICD-10-CM | POA: Diagnosis not present

## 2022-08-21 DIAGNOSIS — E785 Hyperlipidemia, unspecified: Secondary | ICD-10-CM | POA: Diagnosis not present

## 2022-08-21 DIAGNOSIS — K635 Polyp of colon: Secondary | ICD-10-CM | POA: Diagnosis not present

## 2022-08-21 DIAGNOSIS — I10 Essential (primary) hypertension: Secondary | ICD-10-CM | POA: Diagnosis not present

## 2022-08-21 DIAGNOSIS — I2510 Atherosclerotic heart disease of native coronary artery without angina pectoris: Secondary | ICD-10-CM | POA: Diagnosis not present

## 2022-08-21 DIAGNOSIS — I471 Supraventricular tachycardia, unspecified: Secondary | ICD-10-CM | POA: Diagnosis not present

## 2022-10-15 DIAGNOSIS — M25572 Pain in left ankle and joints of left foot: Secondary | ICD-10-CM | POA: Diagnosis not present

## 2022-11-12 DIAGNOSIS — H524 Presbyopia: Secondary | ICD-10-CM | POA: Diagnosis not present

## 2022-11-12 DIAGNOSIS — Z961 Presence of intraocular lens: Secondary | ICD-10-CM | POA: Diagnosis not present

## 2022-11-12 DIAGNOSIS — H04123 Dry eye syndrome of bilateral lacrimal glands: Secondary | ICD-10-CM | POA: Diagnosis not present

## 2022-11-12 DIAGNOSIS — H43813 Vitreous degeneration, bilateral: Secondary | ICD-10-CM | POA: Diagnosis not present

## 2022-11-28 DIAGNOSIS — M79671 Pain in right foot: Secondary | ICD-10-CM | POA: Diagnosis not present

## 2022-12-21 DIAGNOSIS — M79671 Pain in right foot: Secondary | ICD-10-CM | POA: Diagnosis not present

## 2023-01-21 ENCOUNTER — Ambulatory Visit: Payer: Medicare HMO | Admitting: Cardiology

## 2023-03-06 DIAGNOSIS — I471 Supraventricular tachycardia, unspecified: Secondary | ICD-10-CM | POA: Diagnosis not present

## 2023-03-06 DIAGNOSIS — Z23 Encounter for immunization: Secondary | ICD-10-CM | POA: Diagnosis not present

## 2023-03-06 DIAGNOSIS — Z125 Encounter for screening for malignant neoplasm of prostate: Secondary | ICD-10-CM | POA: Diagnosis not present

## 2023-03-06 DIAGNOSIS — E7800 Pure hypercholesterolemia, unspecified: Secondary | ICD-10-CM | POA: Diagnosis not present

## 2023-03-06 DIAGNOSIS — I10 Essential (primary) hypertension: Secondary | ICD-10-CM | POA: Diagnosis not present

## 2023-03-06 DIAGNOSIS — Z0000 Encounter for general adult medical examination without abnormal findings: Secondary | ICD-10-CM | POA: Diagnosis not present

## 2023-03-06 DIAGNOSIS — K219 Gastro-esophageal reflux disease without esophagitis: Secondary | ICD-10-CM | POA: Diagnosis not present

## 2023-03-06 DIAGNOSIS — Z79899 Other long term (current) drug therapy: Secondary | ICD-10-CM | POA: Diagnosis not present

## 2023-03-06 DIAGNOSIS — Z1159 Encounter for screening for other viral diseases: Secondary | ICD-10-CM | POA: Diagnosis not present

## 2023-03-06 DIAGNOSIS — G4733 Obstructive sleep apnea (adult) (pediatric): Secondary | ICD-10-CM | POA: Diagnosis not present

## 2023-04-18 DIAGNOSIS — G4733 Obstructive sleep apnea (adult) (pediatric): Secondary | ICD-10-CM | POA: Diagnosis not present

## 2023-04-22 ENCOUNTER — Ambulatory Visit: Payer: Medicare HMO | Attending: Cardiology | Admitting: Cardiology

## 2023-04-22 ENCOUNTER — Encounter: Payer: Self-pay | Admitting: Cardiology

## 2023-04-22 VITALS — BP 123/61 | HR 57 | Ht 71.0 in | Wt 182.6 lb

## 2023-04-22 DIAGNOSIS — I2510 Atherosclerotic heart disease of native coronary artery without angina pectoris: Secondary | ICD-10-CM | POA: Insufficient documentation

## 2023-04-22 DIAGNOSIS — E785 Hyperlipidemia, unspecified: Secondary | ICD-10-CM | POA: Insufficient documentation

## 2023-04-22 DIAGNOSIS — R55 Syncope and collapse: Secondary | ICD-10-CM

## 2023-04-22 DIAGNOSIS — I471 Supraventricular tachycardia, unspecified: Secondary | ICD-10-CM | POA: Diagnosis not present

## 2023-04-22 DIAGNOSIS — I10 Essential (primary) hypertension: Secondary | ICD-10-CM | POA: Insufficient documentation

## 2023-04-22 NOTE — Progress Notes (Unsigned)
 Cardiology Office Note:  .   Date:  04/24/2023  ID:  David Fear., DOB [birth year], MRN [id number] PCP: Elias Else, MD  [facility] Providers Cardiologist: Previously followed by Dr. Tresa Endo.  Newly assigned to Bryan Lemma, MD     Chief Complaint  Patient presents with   Follow-up    107-month follow-up.-Transferring care from Dr. Tresa Endo   Palpitations    History of SVT.  Well-controlled.    Patient Profile: .     David Fear. is a healthy appearing 73 y.o. male with a PMH notable for nonocclusive CAD, HLD, HTN, SVT-near syncope, OSA on CPAP history of SBO, back pain with radiculopathy, carotid bruit (minimal disease on carotid Dopplers) who presents here for 9-56-month follow-up at the request of No ref. provider found.  He routinely does exercises including running on a treadmill, crosstraining and elliptical exercises.    -> In May 2024 he had some left arm numbness and chest pressure went to the [place] [facility].  Was then seen and followed by Dr. Tresa Endo but had not had any further chest discomfort.  His SVT was controlled with diltiazem.  Noted to have a murmur evaluate and echocardiogram along with Coronary CTA for chest pain.  No clear etiology for murmur noted.  David Lukehart. was last seen on Jul 02, 2022 as a follow-up evaluation for near syncope/SVT.  He had just returned from a trip to [place].  He made send having some chest discomfort that occurred with driving but not with walking 4 miles every day.   Subjective  Discussed the use of AI scribe software for clinical note transcription with the patient, who gave verbal consent to proceed.  History of Present Illness   David Fear. "David [name]" is a 73 year old male with supraventricular tachycardia and non-occlusive coronary artery disease who presents for follow-up on his cardiac health.  He has a history of supraventricular tachycardia (SVT) which is well-controlled with diltiazem 180 mg twice  daily. No recent episodes of SVT have occurred since the medication adjustment from once daily to twice daily dosing after a previous emergency room visit.  He is on olmesartan 20 mg for blood pressure management, aspirin, and Crestor 20 mg daily for cholesterol control. His recent cholesterol levels were excellent with a total cholesterol of 121 mg/dL, HDL of 52 mg/dL, LDL of 57 mg/dL, and triglycerides of 49 mg/dL. His blood count and liver function tests were normal. He has a coronary calcium score of 407 as of June 2023, with some plaque noted in a side branch artery off the main artery on the front of the heart. No exertional chest pain, pressure, or tightness during physical activities such as using the elliptical machine.  During a trip to [place] about nine months to a year ago, he experienced three episodes of feeling 'overwhelmingly pulled to the ground' while standing in crowded pubs. These episodes resolved within 15-20 minutes without recurrence since then. He does not recall any associated heart racing or significant chest symptoms during these episodes.  He maintains an active lifestyle, regularly using the elliptical machine without any recent episodes of chest discomfort or heart racing. He recalls one past incident on the elliptical where he felt unwell and his heart was racing, but this resolved after a brief rest.     Cardiovascular ROS: no chest pain or dyspnea on exertion positive for - atypical occasional chest pressure that is randomly occurring and very infrequent. negative  for - edema, irregular heartbeat, palpitations, paroxysmal nocturnal dyspnea, rapid heart rate, shortness of breath, or lightheadedness dizziness or wooziness.  Syncope or near syncope, TIA or emesis because.  Claudication.    Objective   Medications - Diltiazem 180 mg twice a day - Benicar 20 mg daily - Aspirin 81 mg daily - Crestor 20 mg a day  Studies Reviewed: Marland Kitchen   EKG  Interpretation Date/Time:  Monday April 22 2023 09:24:08 EST Ventricular Rate:  57 PR Interval:  122 QRS Duration:  152 QT Interval:  450 QTC Calculation: 438 R Axis:   55  Text Interpretation: Sinus bradycardia with occasional Premature ventricular complexes Right bundle branch block When compared with ECG of 01-Jul-2021 09:54, Premature ventricular complexes are now Present Confirmed by Bryan Lemma ([id number]) on 04/22/2023 9:29:00 AM     LABS Total cholesterol: 121 mg/dL (60/45/4098) HDL: 52 mg/dL (11/91/4782) LDL: 57 mg/dL (95/62/1308) Triglycerides: 49 mg/dL (65/78/4696) Hemoglobin: 13.4 g/dL (29/52/8413) Liver function tests: Normal (03/06/2023)  RADIOLOGY Coronary CTA: Coronary calcium score: 407 (07/2021); Coronary artery plaque: Moderate plaque in LAD (25-50%), 50-70% in first diagonal branch (07/2021)  DIAGNOSTIC Echocardiogram (07/14/2021): No abnormalities to explain murmur; EF of 60-65%, no wall motion abnormalities.  Risk Assessment/Calculations:        Physical Exam:   VS:  BP 123/61   Pulse (!) 57   Ht 5\' 11"  (1.803 m)   Wt 182 lb 9.6 oz (82.8 kg)   SpO2 96%   BMI 25.47 kg/m    Wt Readings from Last 3 Encounters:  04/22/23 182 lb 9.6 oz (82.8 kg)  07/02/22 176 lb 12.8 oz (80.2 kg)  03/14/22 172 lb (78 kg)    GEN: Healthy appearing.  Well nourished, well groomed in no acute distress NECK: No JVD; No carotid bruits CARDIAC: Normal S1, S2; RRR, no murmurs, rubs, gallops RESPIRATORY:  Clear to auscultation without rales, wheezing or rhonchi ; nonlabored, good air movement. ABDOMEN: Soft, non-tender, non-distended EXTREMITIES:  No edema; No deformity      ASSESSMENT AND PLAN: .    Problem List Items Addressed This Visit       Cardiology Problems   Coronary artery disease, non-occlusive (Chronic)   Coronary calcium score of 407 with moderate plaque in the LAD and first diagonal branch. No symptoms of angina reported with exercise. -Continue current  medications including Aspirin and Crestor. -Encourage continued physical activity. -Annual follow-up to monitor symptoms and disease progression. -Continue daily Aspirin for coronary artery disease prevention.      Hyperlipidemia with target LDL less than 70 (Chronic)   Well controlled on Crestor 20mg  daily. Recent cholesterol panel showed excellent control. -Continue Crestor 20mg  daily.      Near syncope   No further syncopal episodes.  Avoid aggressive BP management.  Maintain adequate hydration      Primary hypertension (Chronic)   Well controlled on Olmesartan 20mg  daily and diltiazem CD1 180 mg daily. -Continue Olmesartan and diltiazem at current doses.      SVT (supraventricular tachycardia) (HCC) - Primary (Chronic)   Well controlled on Diltiazem 180mg  twice daily.  No recent episodes of SVT reported. -Continue Diltiazem 180mg  twice daily. -Maintain adequate hydration      Relevant Orders   EKG 12-Lead (Completed)     Follow-Up: Return in about 1 year (around 04/21/2024) for [employer].  I spent 46 minutes in the care of David Fear. today including reviewing outside labs from PCPs office via [facility] (1 minute), reviewing  studies (Coronary CTA and echocardiogram-5 minutes), face to face time discussing treatment options (28 minutes), reviewing records from clinics notes by Dr. Tresa Endo, and Fayrene Fearing Allred (5 minutes), 7 minutes, and documenting in the encounter.     Signed, Marykay Lex, MD, MS Bryan Lemma, M.D., M.S. Interventional Cardiologist  [facility]  Pager # [phone] Phone # [phone] [address] [place], [place] [address]

## 2023-04-22 NOTE — Patient Instructions (Signed)

## 2023-04-24 ENCOUNTER — Encounter: Payer: Self-pay | Admitting: Cardiology

## 2023-04-24 NOTE — Assessment & Plan Note (Signed)
 Well controlled on Olmesartan 20mg  daily and diltiazem CD1 180 mg daily. -Continue Olmesartan and diltiazem at current doses.

## 2023-04-24 NOTE — Assessment & Plan Note (Addendum)
 No further syncopal episodes.  Avoid aggressive BP management.  Maintain adequate hydration

## 2023-04-24 NOTE — Assessment & Plan Note (Signed)
 Well controlled on Crestor 20mg  daily. Recent cholesterol panel showed excellent control. -Continue Crestor 20mg  daily.

## 2023-04-24 NOTE — Assessment & Plan Note (Signed)
 Well controlled on Diltiazem 180mg  twice daily.  No recent episodes of SVT reported. -Continue Diltiazem 180mg  twice daily. -Maintain adequate hydration

## 2023-04-24 NOTE — Assessment & Plan Note (Addendum)
 Coronary calcium score of 407 with moderate plaque in the LAD and first diagonal branch. No symptoms of angina reported with exercise. -Continue current medications including Aspirin and Crestor. -Encourage continued physical activity. -Annual follow-up to monitor symptoms and disease progression. -Continue daily Aspirin for coronary artery disease prevention.

## 2023-07-10 DIAGNOSIS — I2510 Atherosclerotic heart disease of native coronary artery without angina pectoris: Secondary | ICD-10-CM | POA: Diagnosis not present

## 2023-07-10 DIAGNOSIS — I6789 Other cerebrovascular disease: Secondary | ICD-10-CM | POA: Diagnosis not present

## 2023-07-10 DIAGNOSIS — I10 Essential (primary) hypertension: Secondary | ICD-10-CM | POA: Diagnosis not present

## 2023-07-12 DIAGNOSIS — I10 Essential (primary) hypertension: Secondary | ICD-10-CM | POA: Diagnosis not present

## 2023-07-12 DIAGNOSIS — I2510 Atherosclerotic heart disease of native coronary artery without angina pectoris: Secondary | ICD-10-CM | POA: Diagnosis not present

## 2023-07-12 DIAGNOSIS — I6789 Other cerebrovascular disease: Secondary | ICD-10-CM | POA: Diagnosis not present

## 2023-07-20 DIAGNOSIS — I2510 Atherosclerotic heart disease of native coronary artery without angina pectoris: Secondary | ICD-10-CM | POA: Diagnosis not present

## 2023-07-20 DIAGNOSIS — E7800 Pure hypercholesterolemia, unspecified: Secondary | ICD-10-CM | POA: Diagnosis not present

## 2023-07-20 DIAGNOSIS — I6789 Other cerebrovascular disease: Secondary | ICD-10-CM | POA: Diagnosis not present

## 2023-07-20 DIAGNOSIS — I10 Essential (primary) hypertension: Secondary | ICD-10-CM | POA: Diagnosis not present

## 2023-08-10 DIAGNOSIS — I10 Essential (primary) hypertension: Secondary | ICD-10-CM | POA: Diagnosis not present

## 2023-08-10 DIAGNOSIS — I6789 Other cerebrovascular disease: Secondary | ICD-10-CM | POA: Diagnosis not present

## 2023-08-10 DIAGNOSIS — I2510 Atherosclerotic heart disease of native coronary artery without angina pectoris: Secondary | ICD-10-CM | POA: Diagnosis not present

## 2023-08-19 DIAGNOSIS — I10 Essential (primary) hypertension: Secondary | ICD-10-CM | POA: Diagnosis not present

## 2023-08-19 DIAGNOSIS — E7800 Pure hypercholesterolemia, unspecified: Secondary | ICD-10-CM | POA: Diagnosis not present

## 2023-08-19 DIAGNOSIS — I2510 Atherosclerotic heart disease of native coronary artery without angina pectoris: Secondary | ICD-10-CM | POA: Diagnosis not present

## 2023-08-19 DIAGNOSIS — I6789 Other cerebrovascular disease: Secondary | ICD-10-CM | POA: Diagnosis not present

## 2023-09-03 DIAGNOSIS — I10 Essential (primary) hypertension: Secondary | ICD-10-CM | POA: Diagnosis not present

## 2023-09-03 DIAGNOSIS — E7800 Pure hypercholesterolemia, unspecified: Secondary | ICD-10-CM | POA: Diagnosis not present

## 2023-09-03 DIAGNOSIS — R5383 Other fatigue: Secondary | ICD-10-CM | POA: Diagnosis not present

## 2023-09-03 DIAGNOSIS — R809 Proteinuria, unspecified: Secondary | ICD-10-CM | POA: Diagnosis not present

## 2023-09-03 DIAGNOSIS — L989 Disorder of the skin and subcutaneous tissue, unspecified: Secondary | ICD-10-CM | POA: Diagnosis not present

## 2023-09-03 DIAGNOSIS — I4719 Other supraventricular tachycardia: Secondary | ICD-10-CM | POA: Diagnosis not present

## 2023-09-03 DIAGNOSIS — I2510 Atherosclerotic heart disease of native coronary artery without angina pectoris: Secondary | ICD-10-CM | POA: Diagnosis not present

## 2023-09-03 DIAGNOSIS — G4733 Obstructive sleep apnea (adult) (pediatric): Secondary | ICD-10-CM | POA: Diagnosis not present

## 2023-09-09 DIAGNOSIS — I10 Essential (primary) hypertension: Secondary | ICD-10-CM | POA: Diagnosis not present

## 2023-09-09 DIAGNOSIS — I2510 Atherosclerotic heart disease of native coronary artery without angina pectoris: Secondary | ICD-10-CM | POA: Diagnosis not present

## 2023-09-09 DIAGNOSIS — I6789 Other cerebrovascular disease: Secondary | ICD-10-CM | POA: Diagnosis not present

## 2023-09-18 DIAGNOSIS — D485 Neoplasm of uncertain behavior of skin: Secondary | ICD-10-CM | POA: Diagnosis not present

## 2023-09-18 DIAGNOSIS — L2989 Other pruritus: Secondary | ICD-10-CM | POA: Diagnosis not present

## 2023-09-18 DIAGNOSIS — X32XXXD Exposure to sunlight, subsequent encounter: Secondary | ICD-10-CM | POA: Diagnosis not present

## 2023-09-18 DIAGNOSIS — L570 Actinic keratosis: Secondary | ICD-10-CM | POA: Diagnosis not present

## 2023-09-18 DIAGNOSIS — D225 Melanocytic nevi of trunk: Secondary | ICD-10-CM | POA: Diagnosis not present

## 2023-09-18 DIAGNOSIS — L821 Other seborrheic keratosis: Secondary | ICD-10-CM | POA: Diagnosis not present

## 2023-09-19 DIAGNOSIS — I2510 Atherosclerotic heart disease of native coronary artery without angina pectoris: Secondary | ICD-10-CM | POA: Diagnosis not present

## 2023-09-19 DIAGNOSIS — I6789 Other cerebrovascular disease: Secondary | ICD-10-CM | POA: Diagnosis not present

## 2023-09-19 DIAGNOSIS — E7800 Pure hypercholesterolemia, unspecified: Secondary | ICD-10-CM | POA: Diagnosis not present

## 2023-09-19 DIAGNOSIS — I10 Essential (primary) hypertension: Secondary | ICD-10-CM | POA: Diagnosis not present

## 2023-10-09 DIAGNOSIS — I6789 Other cerebrovascular disease: Secondary | ICD-10-CM | POA: Diagnosis not present

## 2023-10-09 DIAGNOSIS — I2510 Atherosclerotic heart disease of native coronary artery without angina pectoris: Secondary | ICD-10-CM | POA: Diagnosis not present

## 2023-10-09 DIAGNOSIS — I10 Essential (primary) hypertension: Secondary | ICD-10-CM | POA: Diagnosis not present

## 2023-10-11 DIAGNOSIS — Z1211 Encounter for screening for malignant neoplasm of colon: Secondary | ICD-10-CM | POA: Diagnosis not present

## 2023-10-11 DIAGNOSIS — K648 Other hemorrhoids: Secondary | ICD-10-CM | POA: Diagnosis not present

## 2023-10-11 DIAGNOSIS — G4730 Sleep apnea, unspecified: Secondary | ICD-10-CM | POA: Diagnosis not present

## 2023-10-11 DIAGNOSIS — I10 Essential (primary) hypertension: Secondary | ICD-10-CM | POA: Diagnosis not present

## 2023-10-11 DIAGNOSIS — D124 Benign neoplasm of descending colon: Secondary | ICD-10-CM | POA: Diagnosis not present

## 2023-10-11 DIAGNOSIS — K635 Polyp of colon: Secondary | ICD-10-CM | POA: Diagnosis not present

## 2023-10-11 DIAGNOSIS — D122 Benign neoplasm of ascending colon: Secondary | ICD-10-CM | POA: Diagnosis not present

## 2023-10-11 DIAGNOSIS — Z860101 Personal history of adenomatous and serrated colon polyps: Secondary | ICD-10-CM | POA: Diagnosis not present

## 2023-10-11 DIAGNOSIS — D369 Benign neoplasm, unspecified site: Secondary | ICD-10-CM | POA: Diagnosis not present

## 2023-10-11 DIAGNOSIS — K640 First degree hemorrhoids: Secondary | ICD-10-CM | POA: Diagnosis not present

## 2023-10-11 DIAGNOSIS — K5730 Diverticulosis of large intestine without perforation or abscess without bleeding: Secondary | ICD-10-CM | POA: Diagnosis not present

## 2023-10-14 DIAGNOSIS — D225 Melanocytic nevi of trunk: Secondary | ICD-10-CM | POA: Diagnosis not present

## 2023-10-14 DIAGNOSIS — D485 Neoplasm of uncertain behavior of skin: Secondary | ICD-10-CM | POA: Diagnosis not present

## 2023-10-20 DIAGNOSIS — I10 Essential (primary) hypertension: Secondary | ICD-10-CM | POA: Diagnosis not present

## 2023-10-20 DIAGNOSIS — I2510 Atherosclerotic heart disease of native coronary artery without angina pectoris: Secondary | ICD-10-CM | POA: Diagnosis not present

## 2023-10-20 DIAGNOSIS — I6789 Other cerebrovascular disease: Secondary | ICD-10-CM | POA: Diagnosis not present

## 2023-10-20 DIAGNOSIS — E7800 Pure hypercholesterolemia, unspecified: Secondary | ICD-10-CM | POA: Diagnosis not present

## 2023-11-08 DIAGNOSIS — I6789 Other cerebrovascular disease: Secondary | ICD-10-CM | POA: Diagnosis not present

## 2023-11-08 DIAGNOSIS — I10 Essential (primary) hypertension: Secondary | ICD-10-CM | POA: Diagnosis not present

## 2023-11-08 DIAGNOSIS — I2510 Atherosclerotic heart disease of native coronary artery without angina pectoris: Secondary | ICD-10-CM | POA: Diagnosis not present

## 2023-11-12 DIAGNOSIS — H04123 Dry eye syndrome of bilateral lacrimal glands: Secondary | ICD-10-CM | POA: Diagnosis not present

## 2023-11-12 DIAGNOSIS — H43813 Vitreous degeneration, bilateral: Secondary | ICD-10-CM | POA: Diagnosis not present

## 2023-11-12 DIAGNOSIS — H524 Presbyopia: Secondary | ICD-10-CM | POA: Diagnosis not present

## 2023-11-12 DIAGNOSIS — H5319 Other subjective visual disturbances: Secondary | ICD-10-CM | POA: Diagnosis not present

## 2023-11-19 DIAGNOSIS — I6789 Other cerebrovascular disease: Secondary | ICD-10-CM | POA: Diagnosis not present

## 2023-11-19 DIAGNOSIS — I2510 Atherosclerotic heart disease of native coronary artery without angina pectoris: Secondary | ICD-10-CM | POA: Diagnosis not present

## 2023-11-19 DIAGNOSIS — E7800 Pure hypercholesterolemia, unspecified: Secondary | ICD-10-CM | POA: Diagnosis not present

## 2023-11-19 DIAGNOSIS — I10 Essential (primary) hypertension: Secondary | ICD-10-CM | POA: Diagnosis not present

## 2023-12-08 DIAGNOSIS — I6789 Other cerebrovascular disease: Secondary | ICD-10-CM | POA: Diagnosis not present

## 2023-12-08 DIAGNOSIS — I2510 Atherosclerotic heart disease of native coronary artery without angina pectoris: Secondary | ICD-10-CM | POA: Diagnosis not present

## 2023-12-08 DIAGNOSIS — I10 Essential (primary) hypertension: Secondary | ICD-10-CM | POA: Diagnosis not present

## 2023-12-20 DIAGNOSIS — I2510 Atherosclerotic heart disease of native coronary artery without angina pectoris: Secondary | ICD-10-CM | POA: Diagnosis not present

## 2023-12-20 DIAGNOSIS — E7800 Pure hypercholesterolemia, unspecified: Secondary | ICD-10-CM | POA: Diagnosis not present

## 2023-12-20 DIAGNOSIS — I6789 Other cerebrovascular disease: Secondary | ICD-10-CM | POA: Diagnosis not present

## 2023-12-20 DIAGNOSIS — I10 Essential (primary) hypertension: Secondary | ICD-10-CM | POA: Diagnosis not present

## 2024-01-07 DIAGNOSIS — I10 Essential (primary) hypertension: Secondary | ICD-10-CM | POA: Diagnosis not present

## 2024-01-07 DIAGNOSIS — I6789 Other cerebrovascular disease: Secondary | ICD-10-CM | POA: Diagnosis not present

## 2024-01-07 DIAGNOSIS — I2510 Atherosclerotic heart disease of native coronary artery without angina pectoris: Secondary | ICD-10-CM | POA: Diagnosis not present

## 2024-01-19 DIAGNOSIS — I2510 Atherosclerotic heart disease of native coronary artery without angina pectoris: Secondary | ICD-10-CM | POA: Diagnosis not present

## 2024-01-19 DIAGNOSIS — I6789 Other cerebrovascular disease: Secondary | ICD-10-CM | POA: Diagnosis not present

## 2024-01-19 DIAGNOSIS — I10 Essential (primary) hypertension: Secondary | ICD-10-CM | POA: Diagnosis not present

## 2024-01-19 DIAGNOSIS — E7800 Pure hypercholesterolemia, unspecified: Secondary | ICD-10-CM | POA: Diagnosis not present

## 2024-04-21 ENCOUNTER — Ambulatory Visit: Admitting: Cardiology
